# Patient Record
Sex: Male | Born: 1995
Health system: Southern US, Community
[De-identification: ages and names within clinical notes are randomized; demographics above are authoritative.]

## PROBLEM LIST (undated history)

## (undated) DIAGNOSIS — I491 Atrial premature depolarization: Secondary | ICD-10-CM

## (undated) DIAGNOSIS — I493 Ventricular premature depolarization: Secondary | ICD-10-CM

## (undated) DIAGNOSIS — R002 Palpitations: Secondary | ICD-10-CM

## (undated) HISTORY — DX: Atrial premature depolarization: I49.1

## (undated) HISTORY — DX: Ventricular premature depolarization: I49.3

## (undated) HISTORY — DX: Palpitations: R00.2

---
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
## 2017-07-29 DIAGNOSIS — R509 Fever, unspecified: Secondary | ICD-10-CM | POA: Insufficient documentation

## 2017-07-29 DIAGNOSIS — J019 Acute sinusitis, unspecified: Secondary | ICD-10-CM | POA: Insufficient documentation

## 2018-08-01 DIAGNOSIS — R0602 Shortness of breath: Secondary | ICD-10-CM | POA: Insufficient documentation

## 2018-08-01 DIAGNOSIS — R059 Cough, unspecified: Secondary | ICD-10-CM | POA: Insufficient documentation

## 2018-08-04 DIAGNOSIS — F411 Generalized anxiety disorder: Secondary | ICD-10-CM | POA: Insufficient documentation

## 2018-08-11 ENCOUNTER — Telehealth: Payer: Self-pay | Admitting: Cardiovascular Disease

## 2018-08-11 NOTE — Telephone Encounter (Signed)
Virtual Visit Pre-Appointment Phone Call  Steps For Call:  Confirm consent - "In the setting of the current Covid19 crisis, you are scheduled for a (phone or video) visit with your provider on (date) at (time).  Just as we do with many in-office visits, in order for you to participate in this visit, we must obtain consent.  If you'd like, I can send this to your mychart (if signed up) or email for you to review.  Otherwise, I can obtain your verbal consent now.  All virtual visits are billed to your insurance company just like a normal visit would be.  By agreeing to a virtual visit, we'd like you to understand that the technology does not allow for your provider to perform an examination, and thus may limit your provider's ability to fully assess your condition.  Finally, though the technology is pretty good, we cannot assure that it will always work on either your or our end, and in the setting of a video visit, we may have to convert it to a phone-only visit.  In either situation, we cannot ensure that we have a secure connection.  Are you willing to proceed?" STAFF: Did the patient verbally acknowledge consent to telehealth visit? Document YES/NO here    YES   1. Confirm the BEST phone number to call the day of the visit by including in appointment notes [phone]  2. Give patient instructions for WebEx/MyChart download to smartphone as below or Doximity/Doxy.me if video visit (depending on what platform provider is using)  3. Advise patient to be prepared with their blood pressure, heart rate, weight, any heart rhythm information, their current medicines, and a piece of paper and pen handy for any instructions they may receive the day of their visit  4. Inform patient they will receive a phone call 15 minutes prior to their appointment time (may be from unknown caller ID) so they should be prepared to answer  5. Confirm that appointment type is correct in Epic appointment notes (VIDEO vs  PHONE)     TELEPHONE CALL NOTE  Lawrence Whitehead has been deemed a candidate for a follow-up tele-health visit to limit community exposure during the Covid-19 pandemic. I spoke with the patient via phone to ensure availability of phone/video source, confirm preferred email & phone number, and discuss instructions and expectations.  I reminded Lawrence Whitehead to be prepared with any vital sign and/or heart rhythm information that could potentially be obtained via home monitoring, at the time of his visit. I reminded Lawrence Whitehead to expect a phone call at the time of his visit if his visit.  Megan SalonVicky T Slaughter 08/11/2018 4:59 PM   INSTRUCTIONS FOR DOWNLOADING THE WEBEX APP TO SMARTPHONE  - If Apple, ask patient to go to [employer] Store and type in WebEx in the search bar. Download [employer]Webex Meetings, the blue/green circle. If Android, go to [employer] Play Store and type in [employer]WebEx in the search bar. The app is free but as with any other app downloads, their phone may require them to verify saved payment information or Apple/Android password.  - The patient does NOT have to create an account. - On the day of the visit, the assist will walk the patient through joining the meeting with the meeting number/password.  INSTRUCTIONS FOR DOWNLOADING THE MYCHART APP TO SMARTPHONE  - The patient must first make sure to have activated MyChart and know their login information - If Apple, go to App  Store and type in [employer] in the search bar and download the app. If Android, ask patient to go to [employer] and type in [place] in the search bar and download the app. The app is free but as with any other app downloads, their phone may require them to verify saved payment information or Apple/Android password.  - The patient will need to then log into the app with their MyChart username and password, and select [facility] as their healthcare provider to link the account. When it is time for your visit, go to  the MyChart app, find appointments, and click Begin Video Visit. Be sure to Select Allow for your device to access the Microphone and Camera for your visit. You will then be connected, and your provider will be with you shortly.  **If they have any issues connecting, or need assistance please contact MyChart service desk [phone])**  **If using a computer, in order to ensure the best quality for their visit they will need to use either of the following Internet Browsers: [employer], or Google Chrome**  IF USING DOXIMITY or DOXY.ME - The patient will receive a link just prior to their visit, either by text or email (to be determined day of appointment depending on if it's doxy.me or Doximity).     FULL LENGTH CONSENT FOR TELE-HEALTH VISIT   I hereby voluntarily request, consent and authorize [facility] and its employed or contracted physicians, physician assistants, nurse practitioners or other licensed health care professionals (the Practitioner), to provide me with telemedicine health care services (the Services") as deemed necessary by the treating Practitioner. I acknowledge and consent to receive the Services by the Practitioner via telemedicine. I understand that the telemedicine visit will involve communicating with the Practitioner through live audiovisual communication technology and the disclosure of certain medical information by electronic transmission. I acknowledge that I have been given the opportunity to request an in-person assessment or other available alternative prior to the telemedicine visit and am voluntarily participating in the telemedicine visit.  I understand that I have the right to withhold or withdraw my consent to the use of telemedicine in the course of my care at any time, without affecting my right to future care or treatment, and that the Practitioner or I may terminate the telemedicine visit at any time. I understand that I have the right to  inspect all information obtained and/or recorded in the course of the telemedicine visit and may receive copies of available information for a reasonable fee.  I understand that some of the potential risks of receiving the Services via telemedicine include:   Delay or interruption in medical evaluation due to technological equipment failure or disruption;  Information transmitted may not be sufficient (e.g. poor resolution of images) to allow for appropriate medical decision making by the Practitioner; and/or   In rare instances, security protocols could fail, causing a breach of personal health information.  Furthermore, I acknowledge that it is my responsibility to provide information about my medical history, conditions and care that is complete and accurate to the best of my ability. I acknowledge that Practitioner's advice, recommendations, and/or decision may be based on factors not within their control, such as incomplete or inaccurate data provided by me or distortions of diagnostic images or specimens that may result from electronic transmissions. I understand that the practice of medicine is not an exact science and that Practitioner makes no warranties or guarantees regarding treatment outcomes. I acknowledge that I  will receive a copy of this consent concurrently upon execution via email to the email address I last provided but may also request a printed copy by calling the office of [facility].    I understand that my insurance will be billed for this visit.   I have read or had this consent read to me.  I understand the contents of this consent, which adequately explains the benefits and risks of the Services being provided via telemedicine.   I have been provided ample opportunity to ask questions regarding this consent and the Services and have had my questions answered to my satisfaction.  I give my informed consent for the services to be provided through the use of  telemedicine in my medical care  By participating in this telemedicine visit I agree to the above.

## 2018-08-12 ENCOUNTER — Encounter: Payer: Self-pay | Admitting: *Deleted

## 2018-08-13 ENCOUNTER — Encounter: Payer: Self-pay | Admitting: *Deleted

## 2018-08-13 ENCOUNTER — Ambulatory Visit (INDEPENDENT_AMBULATORY_CARE_PROVIDER_SITE_OTHER): Payer: BLUE CROSS/BLUE SHIELD

## 2018-08-13 ENCOUNTER — Encounter: Payer: Self-pay | Admitting: Cardiovascular Disease

## 2018-08-13 ENCOUNTER — Telehealth (INDEPENDENT_AMBULATORY_CARE_PROVIDER_SITE_OTHER): Payer: BLUE CROSS/BLUE SHIELD | Admitting: Cardiovascular Disease

## 2018-08-13 VITALS — BP 121/87 | HR 76 | Ht 68.0 in | Wt 157.0 lb

## 2018-08-13 DIAGNOSIS — R002 Palpitations: Secondary | ICD-10-CM

## 2018-08-13 DIAGNOSIS — R079 Chest pain, unspecified: Secondary | ICD-10-CM | POA: Diagnosis not present

## 2018-08-13 NOTE — Addendum Note (Signed)
Addended by: Lesle Chris on: 08/13/2018 04:45 PM   Modules accepted: Orders

## 2018-08-13 NOTE — Patient Instructions (Signed)
Medication Instructions:  Continue all current medications.  Labwork: none  Testing/Procedures:  Your physician has requested that you have an echocardiogram. Echocardiography is a painless test that uses sound waves to create images of your heart. It provides your doctor with information about the size and shape of your heart and how well your heart's chambers and valves are working. This procedure takes approximately one hour. There are no restrictions for this procedure. - THIS WILL BE SCHEDULED AS SOON AS TESTING RESUMES DUE TO COVID 19 (okay to do June/July)   Your physician has recommended that you wear a 48 holter monitor. Holter monitors are medical devices that record the heart's electrical activity. Doctors most often use these monitors to diagnose arrhythmias. Arrhythmias are problems with the speed or rhythm of the heartbeat. The monitor is a small, portable device. You can wear one while you do your normal daily activities. This is usually used to diagnose what is causing palpitations/syncope (passing out). - THIS WILL BE MAILED TO YOUR HOME.   Follow-Up: 3 months   Any Other Special Instructions Will Be Listed Below (If Applicable).  If you need a refill on your cardiac medications before your next appointment, please call your pharmacy.

## 2018-08-13 NOTE — Progress Notes (Signed)
Virtual Visit via Phone Note. Video was attempted multiple times but the patient had technical issues with sound.   This visit type was conducted due to national recommendations for restrictions regarding the COVID-19 Pandemic (e.g. social distancing) in an effort to limit this patient's exposure and mitigate transmission in our community.  Due to his co-morbid illnesses, this patient is at least at moderate risk for complications without adequate follow up.  This format is felt to be most appropriate for this patient at this time.  All issues noted in this document were discussed and addressed.  A limited physical exam was performed with this format.  Please refer to the patient's chart for his consent to telehealth for [facility].   Evaluation Performed:  New Patient visit  Date:  08/13/2018   ID:  Lawrence Whitehead, DOB 1995/07/22, MRN [id number]  Patient Location: Home Provider Location: Office  PCP:  Selinda Flavin, MD  Cardiologist:  No primary care provider on file.  Electrophysiologist:  None   Chief Complaint:  Chest pain  History of Present Illness:    Lawrence Whitehead is a 23 y.o. male with chest pain.  I personally reviewed all records from his PCP.  Heart rate was noted to be 99 bpm when his PCP evaluated him.  Blood pressure was 130/72.  He was given Ativan to be taken as needed.  Chest x-ray showed no active cardiopulmonary disease.  I reviewed labs which showed normal renal function, sodium 145, normal CBC, normal TSH, and normal magnesium.  I also do not have a copy of the ECG but it reportedly showed "no acute findings ".  An echocardiogram was going to be ordered but this was deferred until he was evaluated by cardiology.  He describes his symptoms as "the pain of doing a chest workout ".  He said it is worse at night and when lying down on different sides.  He went to [place] earlier this month and thought he contracted COVID19.  He had chest pain and shortness of  breath and was then tested by his PCP and found to be negative.  He was treated for a respiratory tract infection.  He still has some residual shortness of breath at night.  He said now his chest does not hurt but has a "funny feeling ".  He also describes "an irregular heartbeat ".  It bothers him primarily at night.  He told me he has had palpitations since he was 68 or 23 years old.  He said the symptoms never occur when he is getting an ECG performed.  He denies lightheadedness, dizziness, and syncope.  He does have occasional associated nausea.  He has been awoken by palpitations at night.  Ativan does not seem to help.  He does take melatonin at night to help him sleep.  He does not drink coffee, tea, nor energy drinks.  He normally drinks water and had been exercising fairly regularly.  The patient does not have symptoms concerning for COVID-19 infection (fever, chills, cough, or new shortness of breath).    History reviewed. No pertinent past medical history. History reviewed. No pertinent surgical history.   Current Meds  Medication Sig   ELDERBERRY PO Take 1 tablet by mouth daily.   LORazepam (ATIVAN) 0.5 MG tablet Take 0.5 mg by mouth at bedtime as needed for anxiety.   Melatonin 5 MG TABS Take 1 tablet by mouth at bedtime as needed.   Multiple Vitamin (MULTIVITAMIN) tablet Take 1 tablet  by mouth daily.   Vitamin D-Vitamin K (VITAMIN K2-VITAMIN D3 PO) Take 1 tablet by mouth daily.     Allergies:   Patient has no known allergies.   Social History   Tobacco Use   Smoking status: Current Some Day Smoker   Smokeless tobacco: Never Used  Substance Use Topics   Alcohol use: Not on file   Drug use: Not on file     Family Hx: The patient's family history is not on file.  ROS:   Please see the history of present illness.     All other systems reviewed and are negative.   Prior CV studies:   The following studies were reviewed today:  NA  Labs/Other Tests  and Data Reviewed:    EKG:  No ECG reviewed.  Recent Labs: No results found for requested labs within last 8760 hours.   Recent Lipid Panel No results found for: CHOL, TRIG, HDL, CHOLHDL, LDLCALC, LDLDIRECT  Wt Readings from Last 3 Encounters:  08/13/18 157 lb (71.2 kg)     Objective:    Vital Signs:  BP 121/87    Pulse 76    Ht 5\' 8"  (1.727 m)    Wt 157 lb (71.2 kg)    BMI 23.87 kg/m    Brief video encounter before technical issues occurred:  Well nourished, well developed male in no acute distress. No jugular venous distention HEENT: Extraocular movements intact, normocephalic/atraumatic  ASSESSMENT & PLAN:    1.  Chest pain and palpitations: Symptoms of chest pain appear atypical for a cardiac etiology.  I will obtain a copy of the ECG from the patient's PCP. I will obtain a 48 hour Holter monitor to assess for any potential arrhythmias. I will order a 2-D echocardiogram with Doppler to evaluate cardiac structure, function, and regional wall motion.   COVID-19 Education: The signs and symptoms of COVID-19 were discussed with the patient and how to seek care for testing (follow up with PCP or arrange E-visit).  The importance of social distancing was discussed today.  Time:   Today, I have spent 30 minutes with the patient with telehealth technology discussing the above problems.     Medication Adjustments/Labs and Tests Ordered: Current medicines are reviewed at length with the patient today.  Concerns regarding medicines are outlined above.   Tests Ordered: No orders of the defined types were placed in this encounter.   Medication Changes: No orders of the defined types were placed in this encounter.   Disposition:  Follow up in 3 month(s)  Signed, Prentice DockerSuresh Maximillian Habibi, MD  08/13/2018 10:23 AM    [facility]

## 2018-08-13 NOTE — Addendum Note (Signed)
Addended by: Lesle Chris on: 08/13/2018 11:49 AM   Modules accepted: Orders

## 2018-08-18 ENCOUNTER — Telehealth: Payer: Self-pay | Admitting: Cardiovascular Disease

## 2018-08-18 NOTE — Telephone Encounter (Signed)
Pt received holter monitor yesterday started wearing it last night around 7pm (confirmed that light came on) wanted to know how long to wear and how to return. Pt aware to wear 48 hours and could call number on the box to schedule UPS pickup or could drop off at UPS box. Pt appreciative.

## 2018-08-18 NOTE — Telephone Encounter (Signed)
Patient would like to speak with nurse regarding questions about event monitor/tg

## 2018-08-20 ENCOUNTER — Telehealth: Payer: Self-pay | Admitting: Cardiovascular Disease

## 2018-08-20 NOTE — Telephone Encounter (Signed)
Pre-cert Verification for the following procedure    48 HR HOLTER -ZIO   Echo scheduled

## 2018-09-09 ENCOUNTER — Other Ambulatory Visit: Payer: Self-pay

## 2018-09-10 ENCOUNTER — Other Ambulatory Visit: Payer: Self-pay | Admitting: Cardiovascular Disease

## 2018-09-10 ENCOUNTER — Telehealth: Payer: Self-pay | Admitting: Cardiovascular Disease

## 2018-09-10 DIAGNOSIS — R079 Chest pain, unspecified: Secondary | ICD-10-CM

## 2018-09-10 NOTE — Telephone Encounter (Signed)
Requesting results from monitor

## 2018-09-10 NOTE — Telephone Encounter (Signed)
Returned pt call. NA, unable to leave msg d/t mailbox being full.

## 2018-09-14 NOTE — Telephone Encounter (Signed)
Called patient with test results. No answer. Unable to leave msg.  

## 2018-09-14 NOTE — Telephone Encounter (Signed)
Spoke with pt, notified of event monitor results. Pt voiced understanding.

## 2018-09-22 ENCOUNTER — Telehealth: Payer: Self-pay | Admitting: *Deleted

## 2018-09-22 NOTE — Telephone Encounter (Signed)
Patient wanted to know your thoughts on taking antidepressant.  Stated his pmd give him Ativan, but doesn't seem to do much.  Says he has only slept 2-3 hours over the last several days.  States pmd discussed maybe trying.  Informed patient that for starters we can move his Echo up, originally scheduled for 10/14/2018.  Rescheduled for 09/29/2018 at [facility].

## 2018-09-22 NOTE — Telephone Encounter (Signed)
Notes recorded by Lesle Chris, LPN on 08/23/8339 at 3:20 PM EDT Patient notified and verbalized understanding.   ------  Notes recorded by Lesle Chris, LPN on 9/62/2297 at 11:05 AM EDT Correction, mail box full & unable to leave message. ------  Notes recorded by Lesle Chris, LPN on 9/89/2119 at 11:05 AM EDT Left message to return call.  ------  Notes recorded by Laqueta Linden, MD on 09/11/2018 at 2:52 PM EDT Sinus rhythm with rare PAC's and PVC's. No significant arrhythmias.

## 2018-09-22 NOTE — Telephone Encounter (Signed)
Will await echo. I would defer antidepressants and discussion of such to PCP.

## 2018-09-23 NOTE — Telephone Encounter (Signed)
Patient notified and verbalized understanding. 

## 2018-09-29 ENCOUNTER — Other Ambulatory Visit: Payer: Self-pay

## 2018-09-29 ENCOUNTER — Ambulatory Visit (HOSPITAL_COMMUNITY)
Admission: RE | Admit: 2018-09-29 | Discharge: 2018-09-29 | Disposition: A | Discharge status: Home / Self Care | Payer: BC Managed Care – PPO | Source: Ambulatory Visit | Attending: Cardiovascular Disease | Admitting: Cardiovascular Disease

## 2018-09-29 DIAGNOSIS — R079 Chest pain, unspecified: Secondary | ICD-10-CM

## 2018-09-29 NOTE — Progress Notes (Signed)
*  PRELIMINARY RESULTS* Echocardiogram 2D Echocardiogram has been performed.  Stacey Drain 09/29/2018, 9:24 AM

## 2018-10-14 ENCOUNTER — Other Ambulatory Visit: Payer: BLUE CROSS/BLUE SHIELD

## 2018-11-05 ENCOUNTER — Telehealth: Payer: Self-pay | Admitting: Cardiovascular Disease

## 2018-11-05 NOTE — Telephone Encounter (Signed)
Virtual Visit Pre-Appointment Phone Call  "(Name), I am calling you today to discuss your upcoming appointment. We are currently trying to limit exposure to the virus that causes COVID-19 by seeing patients at home rather than in the office."  1. "What is the BEST phone number to call the day of the visit?" - include this in appointment notes  2. Do you have or have access to (through a family member/friend) a smartphone with video capability that we can use for your visit?" a. If yes - list this number in appt notes as cell (if different from BEST phone #) and list the appointment type as a VIDEO visit in appointment notes b. If no - list the appointment type as a PHONE visit in appointment notes  3. Confirm consent - "In the setting of the current Covid19 crisis, you are scheduled for a (phone or video) visit with your provider on (date) at (time).  Just as we do with many in-office visits, in order for you to participate in this visit, we must obtain consent.  If you'd like, I can send this to your mychart (if signed up) or email for you to review.  Otherwise, I can obtain your verbal consent now.  All virtual visits are billed to your insurance company just like a normal visit would be.  By agreeing to a virtual visit, we'd like you to understand that the technology does not allow for your provider to perform an examination, and thus may limit your provider's ability to fully assess your condition. If your provider identifies any concerns that need to be evaluated in person, we will make arrangements to do so.  Finally, though the technology is pretty good, we cannot assure that it will always work on either your or our end, and in the setting of a video visit, we may have to convert it to a phone-only visit.  In either situation, we cannot ensure that we have a secure connection.  Are you willing to proceed?" STAFF: Did the patient verbally acknowledge consent to telehealth visit? Document  YES/NO here: yes  4. Advise patient to be prepared - "Two hours prior to your appointment, go ahead and check your blood pressure, pulse, oxygen saturation, and your weight (if you have the equipment to check those) and write them all down. When your visit starts, your provider will ask you for this information. If you have an Apple Watch or Kardia device, please plan to have heart rate information ready on the day of your appointment. Please have a pen and paper handy nearby the day of the visit as well."  5. Give patient instructions for MyChart download to smartphone OR [email or web address] as below if video visit (depending on what platform provider is using)  6. Inform patient they will receive a phone call 15 minutes prior to their appointment time (may be from unknown caller ID) so they should be prepared to answer    TELEPHONE CALL NOTE  Lawrence Whitehead has been deemed a candidate for a follow-up tele-health visit to limit community exposure during the Covid-19 pandemic. I spoke with the patient via phone to ensure availability of phone/video source, confirm preferred email & phone number, and discuss instructions and expectations.  I reminded Lawrence Whitehead to be prepared with any vital sign and/or heart rhythm information that could potentially be obtained via home monitoring, at the time of his visit. I reminded Lawrence Whitehead to expect a phone call prior to  his visit.  [name] 11/05/2018 10:08 AM   INSTRUCTIONS FOR DOWNLOADING THE MYCHART APP TO SMARTPHONE  - The patient must first make sure to have activated MyChart and know their login information - If Apple, go to [employer] and type in MyChart in the search bar and download the app. If Android, ask patient to go to [employer] and type in [place] in the search bar and download the app. The app is free but as with any other app downloads, their phone may require them to verify saved payment information or  Apple/Android password.  - The patient will need to then log into the app with their MyChart username and password, and select [facility] as their healthcare provider to link the account. When it is time for your visit, go to the MyChart app, find appointments, and click Begin Video Visit. Be sure to Select Allow for your device to access the Microphone and Camera for your visit. You will then be connected, and your provider will be with you shortly.  **If they have any issues connecting, or need assistance please contact MyChart service desk [phone])**  **If using a computer, in order to ensure the best quality for their visit they will need to use either of the following Internet Browsers: [employer], or Google Chrome**  IF USING DOXIMITY or DOXY.ME - The patient will receive a link just prior to their visit by text.     FULL LENGTH CONSENT FOR TELE-HEALTH VISIT   I hereby voluntarily request, consent and authorize [facility] and its employed or contracted physicians, physician assistants, nurse practitioners or other licensed health care professionals (the Practitioner), to provide me with telemedicine health care services (the Services") as deemed necessary by the treating Practitioner. I acknowledge and consent to receive the Services by the Practitioner via telemedicine. I understand that the telemedicine visit will involve communicating with the Practitioner through live audiovisual communication technology and the disclosure of certain medical information by electronic transmission. I acknowledge that I have been given the opportunity to request an in-person assessment or other available alternative prior to the telemedicine visit and am voluntarily participating in the telemedicine visit.  I understand that I have the right to withhold or withdraw my consent to the use of telemedicine in the course of my care at any time, without affecting my right to future care  or treatment, and that the Practitioner or I may terminate the telemedicine visit at any time. I understand that I have the right to inspect all information obtained and/or recorded in the course of the telemedicine visit and may receive copies of available information for a reasonable fee.  I understand that some of the potential risks of receiving the Services via telemedicine include:   Delay or interruption in medical evaluation due to technological equipment failure or disruption;  Information transmitted may not be sufficient (e.g. poor resolution of images) to allow for appropriate medical decision making by the Practitioner; and/or   In rare instances, security protocols could fail, causing a breach of personal health information.  Furthermore, I acknowledge that it is my responsibility to provide information about my medical history, conditions and care that is complete and accurate to the best of my ability. I acknowledge that Practitioner's advice, recommendations, and/or decision may be based on factors not within their control, such as incomplete or inaccurate data provided by me or distortions of diagnostic images or specimens that may result from electronic transmissions. I  understand that the practice of medicine is not an exact science and that Practitioner makes no warranties or guarantees regarding treatment outcomes. I acknowledge that I will receive a copy of this consent concurrently upon execution via email to the email address I last provided but may also request a printed copy by calling the office of [facility].    I understand that my insurance will be billed for this visit.   I have read or had this consent read to me.  I understand the contents of this consent, which adequately explains the benefits and risks of the Services being provided via telemedicine.   I have been provided ample opportunity to ask questions regarding this consent and the Services and have had  my questions answered to my satisfaction.  I give my informed consent for the services to be provided through the use of telemedicine in my medical care  By participating in this telemedicine visit I agree to the above.

## 2018-11-11 ENCOUNTER — Encounter: Payer: Self-pay | Admitting: *Deleted

## 2018-11-11 ENCOUNTER — Telehealth: Payer: Self-pay | Admitting: Cardiovascular Disease

## 2018-11-11 NOTE — Telephone Encounter (Signed)
Virtual Visit Pre-Appointment Phone Call  "(Name), I am calling you today to discuss your upcoming appointment. We are currently trying to limit exposure to the virus that causes COVID-19 by seeing patients at home rather than in the office."  1. "What is the BEST phone number to call the day of the visit?" - [phone]  2. Do you have or have access to (through a family member/friend) a smartphone with video capability that we can use for your visit?" a. If yes - list this number in appt notes as cell (if different from BEST phone #) and list the appointment type as a VIDEO visit in appointment notes b. If no - list the appointment type as a PHONE visit in appointment notes  3. Confirm consent - "In the setting of the current Covid19 crisis, you are scheduled for a (phone or video) visit with your provider on (date) at (time).  Just as we do with many in-office visits, in order for you to participate in this visit, we must obtain consent.  If you'd like, I can send this to your mychart (if signed up) or email for you to review.  Otherwise, I can obtain your verbal consent now.  All virtual visits are billed to your insurance company just like a normal visit would be.  By agreeing to a virtual visit, we'd like you to understand that the technology does not allow for your provider to perform an examination, and thus may limit your provider's ability to fully assess your condition. If your provider identifies any concerns that need to be evaluated in person, we will make arrangements to do so.  Finally, though the technology is pretty good, we cannot assure that it will always work on either your or our end, and in the setting of a video visit, we may have to convert it to a phone-only visit.  In either situation, we cannot ensure that we have a secure connection.  Are you willing to proceed?" STAFF: Did the patient verbally acknowledge consent to telehealth visit? Document YES/NO here:  YES  4. Advise patient to be prepared - "Two hours prior to your appointment, go ahead and check your blood pressure, pulse, oxygen saturation, and your weight (if you have the equipment to check those) and write them all down. When your visit starts, your provider will ask you for this information. If you have an Apple Watch or Kardia device, please plan to have heart rate information ready on the day of your appointment. Please have a pen and paper handy nearby the day of the visit as well."  5. Give patient instructions for MyChart download to smartphone OR Doximity/Doxy.me as below if video visit (depending on what platform provider is using)  6. Inform patient they will receive a phone call 15 minutes prior to their appointment time (may be from unknown caller ID) so they should be prepared to answer    TELEPHONE CALL NOTE  Lawrence Whitehead has been deemed a candidate for a follow-up tele-health visit to limit community exposure during the Covid-19 pandemic. I spoke with the patient via phone to ensure availability of phone/video source, confirm preferred email & phone number, and discuss instructions and expectations.  I reminded Lawrence Whitehead to be prepared with any vital sign and/or heart rhythm information that could potentially be obtained via home monitoring, at the time of his visit. I reminded Lawrence Whitehead to expect a phone call prior to his visit.  Vicky  T Slaughter 11/11/2018 8:57 AM   INSTRUCTIONS FOR DOWNLOADING THE MYCHART APP TO SMARTPHONE  - The patient must first make sure to have activated MyChart and know their login information - If Apple, go to [employer] and type in MyChart in the search bar and download the app. If Android, ask patient to go to [employer] and type in [place] in the search bar and download the app. The app is free but as with any other app downloads, their phone may require them to verify saved payment information or Apple/Android password.   - The patient will need to then log into the app with their MyChart username and password, and select [facility] as their healthcare provider to link the account. When it is time for your visit, go to the MyChart app, find appointments, and click Begin Video Visit. Be sure to Select Allow for your device to access the Microphone and Camera for your visit. You will then be connected, and your provider will be with you shortly.  **If they have any issues connecting, or need assistance please contact MyChart service desk [phone])**  **If using a computer, in order to ensure the best quality for their visit they will need to use either of the following Internet Browsers: [employer], or Google Chrome**  IF USING DOXIMITY or DOXY.ME - The patient will receive a link just prior to their visit by text.     FULL LENGTH CONSENT FOR TELE-HEALTH VISIT   I hereby voluntarily request, consent and authorize [facility] and its employed or contracted physicians, physician assistants, nurse practitioners or other licensed health care professionals (the Practitioner), to provide me with telemedicine health care services (the Services") as deemed necessary by the treating Practitioner. I acknowledge and consent to receive the Services by the Practitioner via telemedicine. I understand that the telemedicine visit will involve communicating with the Practitioner through live audiovisual communication technology and the disclosure of certain medical information by electronic transmission. I acknowledge that I have been given the opportunity to request an in-person assessment or other available alternative prior to the telemedicine visit and am voluntarily participating in the telemedicine visit.  I understand that I have the right to withhold or withdraw my consent to the use of telemedicine in the course of my care at any time, without affecting my right to future care or treatment, and that  the Practitioner or I may terminate the telemedicine visit at any time. I understand that I have the right to inspect all information obtained and/or recorded in the course of the telemedicine visit and may receive copies of available information for a reasonable fee.  I understand that some of the potential risks of receiving the Services via telemedicine include:   Delay or interruption in medical evaluation due to technological equipment failure or disruption;  Information transmitted may not be sufficient (e.g. poor resolution of images) to allow for appropriate medical decision making by the Practitioner; and/or   In rare instances, security protocols could fail, causing a breach of personal health information.  Furthermore, I acknowledge that it is my responsibility to provide information about my medical history, conditions and care that is complete and accurate to the best of my ability. I acknowledge that Practitioner's advice, recommendations, and/or decision may be based on factors not within their control, such as incomplete or inaccurate data provided by me or distortions of diagnostic images or specimens that may result from electronic transmissions. I understand that the practice  of medicine is not an Chief Strategy Officer and that Practitioner makes no warranties or guarantees regarding treatment outcomes. I acknowledge that I will receive a copy of this consent concurrently upon execution via email to the email address I last provided but may also request a printed copy by calling the office of [facility].    I understand that my insurance will be billed for this visit.   I have read or had this consent read to me.  I understand the contents of this consent, which adequately explains the benefits and risks of the Services being provided via telemedicine.   I have been provided ample opportunity to ask questions regarding this consent and the Services and have had my questions answered to  my satisfaction.  I give my informed consent for the services to be provided through the use of telemedicine in my medical care  By participating in this telemedicine visit I agree to the above.

## 2018-11-12 ENCOUNTER — Encounter: Payer: Self-pay | Admitting: Cardiovascular Disease

## 2018-11-12 ENCOUNTER — Telehealth (INDEPENDENT_AMBULATORY_CARE_PROVIDER_SITE_OTHER): Payer: BC Managed Care – PPO | Admitting: Cardiovascular Disease

## 2018-11-12 VITALS — Ht 68.0 in | Wt 155.0 lb

## 2018-11-12 DIAGNOSIS — R002 Palpitations: Secondary | ICD-10-CM | POA: Diagnosis not present

## 2018-11-12 DIAGNOSIS — F419 Anxiety disorder, unspecified: Secondary | ICD-10-CM

## 2018-11-12 DIAGNOSIS — R079 Chest pain, unspecified: Secondary | ICD-10-CM

## 2018-11-12 NOTE — Patient Instructions (Signed)
Medication Instructions:  Continue all current medications.  Labwork: none  Testing/Procedures: none  Follow-Up: As needed.    Any Other Special Instructions Will Be Listed Below (If Applicable).  If you need a refill on your cardiac medications before your next appointment, please call your pharmacy.  

## 2018-11-12 NOTE — Progress Notes (Signed)
Virtual Visit via Telephone Note   This visit type was conducted due to national recommendations for restrictions regarding the COVID-19 Pandemic (e.g. social distancing) in an effort to limit this patient's exposure and mitigate transmission in our community.  Due to his co-morbid illnesses, this patient is at least at moderate risk for complications without adequate follow up.  This format is felt to be most appropriate for this patient at this time.  The patient did not have access to video technology/had technical difficulties with video requiring transitioning to audio format only (telephone).  All issues noted in this document were discussed and addressed.  No physical exam could be performed with this format.  Please refer to the patient's chart for his  consent to telehealth [facility].   Date:  11/12/2018   ID:  Lawrence Whitehead, DOB 30-Jun-1995, MRN [id number]  Patient Location: Home Provider Location: Office  PCP:  Selinda FlavinHoward, Kevin, MD  Cardiologist:  Prentice DockerSuresh Chriselda Leppert, MD  Electrophysiologist:  None   Evaluation Performed:  Follow-Up Visit  Chief Complaint: Chest pain and palpitations  History of Present Illness:    Lawrence Whitehead is a 23 y.o. male with chest pain and palpitations.  Holter monitoring demonstrated sinus rhythm with rare PACs and PVCs and no significant arrhythmias.  Echocardiogram demonstrated normal left ventricular systolic and diastolic function, LVEF 60 to 40%65%.  His PCP prescribed Ativan as needed.  He seldom takes Ativan.  Chest pain and palpitations have considerably reduced in frequency and intensity.  When they do occur it is usually at night or when he first wakes up.  The patient does not have symptoms concerning for COVID-19 infection (fever, chills, cough, or new shortness of breath).    Past Medical History:  Diagnosis Date  . Palpitations    Past Surgical History:  Procedure Laterality Date  . HERNIA REPAIR  01/2016      Current Meds  Medication Sig  . ELDERBERRY PO Take 1 tablet by mouth daily.  Marland Kitchen. LORazepam (ATIVAN) 0.5 MG tablet Take 0.5 mg by mouth at bedtime as needed for anxiety.  . Melatonin 5 MG TABS Take 1 tablet by mouth at bedtime as needed.  . Multiple Vitamin (MULTIVITAMIN) tablet Take 1 tablet by mouth daily.  . Vitamin D-Vitamin K (VITAMIN K2-VITAMIN D3 PO) Take 1 tablet by mouth daily.     Allergies:   Patient has no known allergies.   Social History   Tobacco Use  . Smoking status: Current Some Day Smoker  . Smokeless tobacco: Never Used  Substance Use Topics  . Alcohol use: Not on file  . Drug use: Not on file     Family Hx: The patient's family history is not on file.  ROS:   Please see the history of present illness.     All other systems reviewed and are negative.   Prior CV studies:   The following studies were reviewed today:  See above  Labs/Other Tests and Data Reviewed:    EKG:  No ECG reviewed.  Recent Labs: No results found for requested labs within last 8760 hours.   Recent Lipid Panel No results found for: CHOL, TRIG, HDL, CHOLHDL, LDLCALC, LDLDIRECT  Wt Readings from Last 3 Encounters:  11/12/18 155 lb (70.3 kg)  08/13/18 157 lb (71.2 kg)     Objective:    Vital Signs:  Ht 5\' 8"  (1.727 m)   Wt 155 lb (70.3 kg)   BMI 23.57 kg/m    VITAL  SIGNS:  reviewed  ASSESSMENT & PLAN:    1.  Chest pain and palpitations: No arrhythmias with event monitoring.  Cardiac function is normal.  This may be due to anxiety.  Symptoms have diminished considerably.  No further cardiac testing is indicated at this time.  2.  Anxiety: Takes Xanax as needed as prescribed by his PCP.  COVID-19 Education: The signs and symptoms of COVID-19 were discussed with the patient and how to seek care for testing (follow up with PCP or arrange E-visit).  The importance of social distancing was discussed today.  Time:   Today, I have spent 5 minutes with the patient with  telehealth technology discussing the above problems.     Medication Adjustments/Labs and Tests Ordered: Current medicines are reviewed at length with the patient today.  Concerns regarding medicines are outlined above.   Tests Ordered: No orders of the defined types were placed in this encounter.   Medication Changes: No orders of the defined types were placed in this encounter.   Follow Up:  As needed  Signed, Kate Sable, MD  11/12/2018 8:54 AM    [name]

## 2018-12-09 DIAGNOSIS — IMO0002 Reserved for concepts with insufficient information to code with codable children: Secondary | ICD-10-CM | POA: Insufficient documentation

## 2018-12-09 DIAGNOSIS — R5381 Other malaise: Secondary | ICD-10-CM | POA: Insufficient documentation

## 2018-12-09 DIAGNOSIS — R110 Nausea: Secondary | ICD-10-CM | POA: Insufficient documentation

## 2019-04-14 DIAGNOSIS — R1084 Generalized abdominal pain: Secondary | ICD-10-CM | POA: Insufficient documentation

## 2019-05-03 DIAGNOSIS — R1110 Vomiting, unspecified: Secondary | ICD-10-CM | POA: Insufficient documentation

## 2019-05-05 ENCOUNTER — Encounter: Payer: Self-pay | Admitting: Gastroenterology

## 2019-05-20 ENCOUNTER — Encounter: Payer: Self-pay | Admitting: Gastroenterology

## 2019-05-20 NOTE — Progress Notes (Signed)
Primary Care Physician:  Lovey Newcomer, PA  Primary Gastroenterologist:  Jonette Eva, MD   Chief Complaint  Patient presents with  . Emesis    nausea  . Abdominal Cramping    HPI:  Lawrence Whitehead is a 24 y.o. male here at the request of Daria Pastures, PA-C for further evaluation of N/V, abdominal pain.  Food allergy testing 03/2019, negative for Pork, Beef, chicken. TSH 0.651 11/2018. CMET normal 11/2018. H.pylori IgG negative. CBC normal.  Abdominal ultrasound May 06, 2019 is negative.  Started back in 01/2019. Acute onset nausea but no vomiting. Several weeks later, started vomiting frequently. Violent heaving several times, then would go to sleep. Last episode of vomiting one week ago. Happened after eating a larger supper than normal. Little bit of bright blood in emesis. If not vomiting, stomach is in knots, feels nauseated throughout the day. Feels like a knot in stomach. Zofran caused some constipation. Now have watery stools this past week. Has happened off/on the past one year. For past couple of years, wakes up and has to eat right away or gets nauseated. If eats, then postprandial urge for BM but doesn't always happen. No heartburn. No melena, brbpr. Thought maybe some lactose intolerance. Cut out milk but does have coffee with creamer most days.   Regular BM 60% of the time. 40% of time having urgency and water stools. At least some every week. Vomiting every 2-3 days. Has lost over 10-15 pounds in past couple of months. Postprandial discomfort mostly in the mornings. Vomiting during middle of day and at night. Had eggs or something with eggs in it except for two times. Eggs, pancakes. Brownies ok.   Omeprazole for 2-3 weeks and didn't make a difference. No heartburn.   Morning and lunch:bagel or english muffin, cream cheese, grape jelly, or peanut butter. Lunch: taco bell doesn't bother, chicken and cheese taco. bojangle chicken sandwish. Water and sweet tea for  beverages. Supper: chicken and beef.  Switched cigarettes to vaping but stopped with all of these symptoms. No marijuana use.  No NSAIDs or aspirin.  Current Outpatient Medications  Medication Sig Dispense Refill  . Melatonin 5 MG TABS Take 1 tablet by mouth at bedtime as needed.    . ondansetron (ZOFRAN-ODT) 4 MG disintegrating tablet Take 4 mg by mouth as needed.     No current facility-administered medications for this visit.    Allergies as of 05/21/2019  . (No Known Allergies)    Past Medical History:  Diagnosis Date  . Palpitations     Past Surgical History:  Procedure Laterality Date  . HERNIA REPAIR  01/2016    Family History  Problem Relation Age of Onset  . Colon cancer Neg Hx   . Celiac disease Neg Hx   . Inflammatory bowel disease Neg Hx     Social History   Socioeconomic History  . Marital status: Single    Spouse name: Not on file  . Number of children: Not on file  . Years of education: Not on file  . Highest education level: Not on file  Occupational History  . Not on file  Tobacco Use  . Smoking status: Former Games developer  . Smokeless tobacco: Never Used  Substance and Sexual Activity  . Alcohol use: Not Currently    Comment: no heavy use  . Drug use: Not Currently  . Sexual activity: Not on file  Other Topics Concern  . Not on file  Social History Narrative  .  Not on file   Social Determinants of Health   Financial Resource Strain:   . Difficulty of Paying Living Expenses: Not on file  Food Insecurity:   . Worried About Charity fundraiser in the Last Year: Not on file  . Ran Out of Food in the Last Year: Not on file  Transportation Needs:   . Lack of Transportation (Medical): Not on file  . Lack of Transportation (Non-Medical): Not on file  Physical Activity:   . Days of Exercise per Week: Not on file  . Minutes of Exercise per Session: Not on file  Stress:   . Feeling of Stress : Not on file  Social Connections:   . Frequency of  Communication with Friends and Family: Not on file  . Frequency of Social Gatherings with Friends and Family: Not on file  . Attends Religious Services: Not on file  . Active Member of Clubs or Organizations: Not on file  . Attends Archivist Meetings: Not on file  . Marital Status: Not on file  Intimate Partner Violence:   . Fear of Current or Ex-Partner: Not on file  . Emotionally Abused: Not on file  . Physically Abused: Not on file  . Sexually Abused: Not on file      ROS:  General: Negative for anorexia, weight loss, fever, chills, fatigue, weakness. Eyes: Negative for vision changes.  ENT: Negative for hoarseness, difficulty swallowing , nasal congestion. CV: Negative for chest pain, angina, palpitations, dyspnea on exertion, peripheral edema.  Respiratory: Negative for dyspnea at rest, dyspnea on exertion, cough, sputum, wheezing.  GI: See history of present illness. GU:  Negative for dysuria, hematuria, urinary incontinence, urinary frequency, nocturnal urination.  MS: Negative for joint pain, low back pain.  Derm: Negative for rash or itching.  Neuro: Negative for weakness, abnormal sensation, seizure, frequent headaches, memory loss, confusion.  Psych: Negative for anxiety, depression, suicidal ideation, hallucinations.  Endo: Negative for unusual weight change.  Heme: Negative for bruising or bleeding. Allergy: Negative for rash or hives.    Physical Examination:  BP 123/77   Pulse 85   Temp (!) 97.1 F (36.2 C) (Temporal)   Ht 5\' 8"  (1.727 m)   Wt 149 lb (67.6 kg)   BMI 22.66 kg/m    General: Well-nourished, well-developed in no acute distress.  Head: Normocephalic, atraumatic.   Eyes: Conjunctiva pink, no icterus. Mouth: masked Neck: Supple   Lungs: Clear to auscultation bilaterally.  Heart: Regular rate and rhythm, no murmurs rubs or gallops.  Abdomen: Bowel sounds are normal, nontender, nondistended, no hepatosplenomegaly or masses, no  abdominal bruits or    hernia , no rebound or guarding.   Rectal: not performed Extremities: No lower extremity edema. No clubbing or deformities.  Neuro: Alert and oriented x 4 , grossly normal neurologically.  Skin: Warm and dry, no rash or jaundice.   Psych: Alert and cooperative, normal mood and affect.  Labs: See hpi  Imaging Studies: No results found.  Impression/plan:  Very pleasant 24 year old gentleman presenting for further evaluation of several month history of nausea and vomiting, abdominal pain, episode of hematemesis.  He also has chronic intermittent diarrhea.  Labs in December negative for allergies to pork, beef, chicken.  In August he had thyroid function checked which was normal.  H pylori serologies negative, CBC normal.  Abdominal ultrasound negative in January of this year.  Patient is mostly concerned he has food allergies recalls symptoms seem to be focused around eggs.  He has decreased dairy intake although continues to consume coffee creamer daily.  Denies any NSAID or aspirin use.  No marijuana use.  It is possible symptoms are related to refractory GERD, gastritis, peptic ulcer disease.  Need to exclude celiac disease.  We will also check alpha gal.  Discussed with patient if labs are unremarkable would consider completing gallbladder work-up with gallbladder function test prior to considering upper endoscopy.  Patient is in agreement with this plan.  Intermittent diarrhea more than a year may be due to food intolerance, IBS, celiac disease, cannot exclude IBD.  Await labs.  Further recommendations to follow.

## 2019-05-21 ENCOUNTER — Other Ambulatory Visit: Payer: Self-pay

## 2019-05-21 ENCOUNTER — Ambulatory Visit (INDEPENDENT_AMBULATORY_CARE_PROVIDER_SITE_OTHER): Payer: 59 | Admitting: Gastroenterology

## 2019-05-21 ENCOUNTER — Encounter: Payer: Self-pay | Admitting: Gastroenterology

## 2019-05-21 VITALS — BP 123/77 | HR 85 | Temp 97.1°F | Ht 68.0 in | Wt 149.0 lb

## 2019-05-21 DIAGNOSIS — R112 Nausea with vomiting, unspecified: Secondary | ICD-10-CM | POA: Insufficient documentation

## 2019-05-21 DIAGNOSIS — R197 Diarrhea, unspecified: Secondary | ICD-10-CM | POA: Diagnosis not present

## 2019-05-21 DIAGNOSIS — R1013 Epigastric pain: Secondary | ICD-10-CM | POA: Insufficient documentation

## 2019-05-21 DIAGNOSIS — K920 Hematemesis: Secondary | ICD-10-CM

## 2019-05-21 NOTE — Patient Instructions (Signed)
1. Please have your labs today.  We will touch base next week with results as available.  Based on findings, we will determine next step in your work-up i.e. complete gallbladder work-up versus upper endoscopy.

## 2019-05-27 LAB — HEPATIC FUNCTION PANEL
AG Ratio: 2 (calc) (ref 1.0–2.5)
ALT: 13 U/L (ref 9–46)
AST: 11 U/L (ref 10–40)
Albumin: 4.7 g/dL (ref 3.6–5.1)
Alkaline phosphatase (APISO): 74 U/L (ref 36–130)
Bilirubin, Direct: 0.1 mg/dL (ref 0.0–0.2)
Globulin: 2.3 g/dL (ref 1.9–3.7)
Indirect Bilirubin: 0.4 mg/dL (ref 0.2–1.2)
Total Bilirubin: 0.5 mg/dL (ref 0.2–1.2)
Total Protein: 7 g/dL (ref 6.1–8.1)

## 2019-05-27 LAB — CBC WITH DIFFERENTIAL/PLATELET
Absolute Monocytes: 494 {cells}/uL (ref 200–950)
Basophils Absolute: 28 {cells}/uL (ref 0–200)
Basophils Relative: 0.6 %
Eosinophils Absolute: 259 {cells}/uL (ref 15–500)
Eosinophils Relative: 5.5 %
HCT: 45.4 % (ref 38.5–50.0)
Hemoglobin: 15.8 g/dL (ref 13.2–17.1)
Lymphs Abs: 1429 {cells}/uL (ref 850–3900)
MCH: 30.2 pg (ref 27.0–33.0)
MCHC: 34.8 g/dL (ref 32.0–36.0)
MCV: 86.8 fL (ref 80.0–100.0)
MPV: 10 fL (ref 7.5–12.5)
Monocytes Relative: 10.5 %
Neutro Abs: 2491 {cells}/uL (ref 1500–7800)
Neutrophils Relative %: 53 %
Platelets: 231 10*3/uL (ref 140–400)
RBC: 5.23 Million/uL (ref 4.20–5.80)
RDW: 11.9 % (ref 11.0–15.0)
Total Lymphocyte: 30.4 %
WBC: 4.7 10*3/uL (ref 3.8–10.8)

## 2019-05-27 LAB — ALPHA-GAL PANEL
Beef IgE: <0.1 kU/L (ref <0.35)
Class: 0
Class: 0
Class: 0
Galactose-alpha-1,3-galactose IgE: <0.1 kU/L (ref <0.10)
LAMB/MUTTON IGE: <0.1 kU/L (ref <0.35)
Pork IgE: <0.1 kU/L (ref <0.35)

## 2019-05-27 LAB — TISSUE TRANSGLUTAMINASE, IGA: (tTG) Ab, IgA: 1 U/mL

## 2019-05-27 LAB — IGA: Immunoglobulin A: 288 mg/dL (ref 47–310)

## 2019-05-31 ENCOUNTER — Telehealth: Payer: Self-pay | Admitting: Gastroenterology

## 2019-05-31 NOTE — Telephone Encounter (Signed)
Pt has labs done on 05/21/2019 and was checking on his results. Please call [phone]

## 2019-06-01 ENCOUNTER — Other Ambulatory Visit: Payer: Self-pay

## 2019-06-01 DIAGNOSIS — R1013 Epigastric pain: Secondary | ICD-10-CM

## 2019-06-01 DIAGNOSIS — R112 Nausea with vomiting, unspecified: Secondary | ICD-10-CM

## 2019-06-01 NOTE — Progress Notes (Unsigned)
nm

## 2019-06-01 NOTE — Telephone Encounter (Signed)
See results. Pt is aware.  

## 2019-06-25 ENCOUNTER — Other Ambulatory Visit (HOSPITAL_COMMUNITY): Payer: 59

## 2019-07-01 ENCOUNTER — Other Ambulatory Visit: Payer: Self-pay

## 2019-07-01 ENCOUNTER — Encounter (HOSPITAL_COMMUNITY)
Admission: RE | Admit: 2019-07-01 | Discharge: 2019-07-01 | Disposition: A | Discharge status: Home / Self Care | Payer: 59 | Source: Ambulatory Visit | Attending: Gastroenterology | Admitting: Gastroenterology

## 2019-07-01 DIAGNOSIS — R112 Nausea with vomiting, unspecified: Secondary | ICD-10-CM

## 2019-07-01 DIAGNOSIS — R1013 Epigastric pain: Secondary | ICD-10-CM | POA: Insufficient documentation

## 2019-07-01 MED ORDER — TECHNETIUM TC 99M MEBROFENIN IV KIT
5.0000 | PACK | Freq: Once | INTRAVENOUS | Status: AC | PRN
  Administered 2019-07-01: 5.2 via INTRAVENOUS

## 2020-03-01 DIAGNOSIS — U071 COVID-19: Secondary | ICD-10-CM | POA: Insufficient documentation

## 2020-05-18 DIAGNOSIS — Z20822 Contact with and (suspected) exposure to covid-19: Secondary | ICD-10-CM | POA: Insufficient documentation

## 2020-07-15 DIAGNOSIS — L03116 Cellulitis of left lower limb: Secondary | ICD-10-CM | POA: Insufficient documentation

## 2020-07-15 DIAGNOSIS — Z6824 Body mass index (BMI) 24.0-24.9, adult: Secondary | ICD-10-CM | POA: Insufficient documentation

## 2020-07-15 DIAGNOSIS — L818 Other specified disorders of pigmentation: Secondary | ICD-10-CM | POA: Insufficient documentation

## 2020-08-07 DIAGNOSIS — R002 Palpitations: Secondary | ICD-10-CM | POA: Insufficient documentation

## 2020-08-13 NOTE — Progress Notes (Signed)
Cardiology Office Note  Date: 08/14/2020   ID: Lawrence Whitehead, DOB [birth year], MRN [id number]  PCP:  Lovey Newcomer, PA  Cardiologist:  Prentice Docker, MD (Inactive) Electrophysiologist:  None   Chief Complaint: Cardiac follow up palpitations.  History of Present Illness: Lawrence Whitehead is a 25 y.o. male with a history of palpitations, chest pain.   Previous holter monitor with NSR with rare PAC's / PVC's. No significant arrhythmias . Echo Normal LVSF and diastolic function. PCP had prescribed prn Ativan.   Last seen by Dr Purvis Sheffield 11/12/2018. His chest pain and palpitations had reduced considerably in frequency and intensity.  He is here today after recently having been seen by his PCP on April 12 with recent complaints of palpitations.  He stated that palpitations have been going on for about a month or more.  Similar to issues he had back in 2020 when he was evaluated by cardiology.  History of drinking alcohol and caffeine at times.  History of vaping.  He stated the palpitations seem to get worse with vaping.  He is here today noting over the last month or so he is having palpitations between 5 to 8-10 times per day.  States he notices it mostly during the evening and nighttime.  He mentions recently it has awakened him from sleep.  States the palpitations are intermittent.  States sometimes they cause a little dizziness.  Denies any near syncopal or syncopal episodes associated.  Denies any chest pain or dyspnea when they occur.  Denies any PND or orthopnea.  He states he has stopped smoking and cut down on caffeine and alcohol.  States the palpitations seem somewhat better but they still occur.  Past Medical History:  Diagnosis Date  . Palpitations     Past Surgical History:  Procedure Laterality Date  . HERNIA REPAIR  01/2016    Current Outpatient Medications  Medication Sig Dispense Refill  . LORazepam (ATIVAN) 1 MG tablet Take 1 mg by mouth daily.     No  current facility-administered medications for this visit.   Allergies:  Patient has no known allergies.   Social History: The patient  reports that he has quit smoking. He has never used smokeless tobacco. He reports previous alcohol use. He reports previous drug use.   Family History: The patient's family history is not on file.   ROS:  Please see the history of present illness. Otherwise, complete review of systems is positive for none.  All other systems are reviewed and negative.   Physical Exam: VS:  BP 112/74   Pulse 81   Ht 5\' 9"  (1.753 m)   Wt 163 lb (73.9 kg)   SpO2 97%   BMI 24.07 kg/m , BMI Body mass index is 24.07 kg/m.  Wt Readings from Last 3 Encounters:  08/14/20 163 lb (73.9 kg)  05/21/19 149 lb (67.6 kg)  11/12/18 155 lb (70.3 kg)    General: Patient appears comfortable at rest. Neck: Supple, no elevated JVP or carotid bruits, no thyromegaly. Lungs: Clear to auscultation, nonlabored breathing at rest. Cardiac: Regular rate and rhythm, no S3 or significant systolic murmur, no pericardial rub. Extremities: No pitting edema, distal pulses 2+. Skin: Warm and dry. Musculoskeletal: No kyphosis. Neuropsychiatric: Alert and oriented x3, affect grossly appropriate.  ECG: EKG from PCP office on 08/07/2020 showed normal sinus rhythm possible left atrial abnormality rate of 66.  Normal axis, nonspecific ST abnormality/elevation  Recent Labwork: No results found for requested labs within  last 8760 hours.  No results found for: CHOL, TRIG, HDL, CHOLHDL, VLDL, LDLCALC, LDLDIRECT  Other Studies Reviewed Today:   Echocardiogram 09/29/2018 IMPRESSIONS  1. The left ventricle has normal systolic function with an ejection  fraction of 60-65%. The cavity size was normal. Left ventricular diastolic  parameters were normal. No evidence of left ventricular regional wall  motion abnormalities.  2. The right ventricle has normal systolic function. The cavity was  normal. There  is no increase in right ventricular wall thickness.  3. The mitral valve is grossly normal.  4. The aortic root is normal in size and structure.  5. The inferior vena cava was dilated in size with >50% respiratory  variability.   Assessment and Plan:  1. Chest pain, unspecified type   2. Anxiety   3. Palpitations    1. Chest pain, unspecified type Currently denies any chest pain.  2. Anxiety States he has issues with anxiety and takes Ativan as needed.  3. Palpitations Having recent complaints over the last month of increasing palpitations between 8-10 times per day.  Describes the palpitations as intermittent and short-lived.  Denies any associated presyncopal or syncopal episodes.  States he may have had some brief lightheadedness on 2 occasions.  Please place a 14-day ZIO XT monitor for evaluation.  Medication Adjustments/Labs and Tests Ordered: Current medicines are reviewed at length with the patient today.  Concerns regarding medicines are outlined above.   Disposition: Follow-up with Dr. Wyline Mood or APP 6 weeks  Signed, Rennis Harding, NP 08/14/2020 10:52 AM    [facility] [address] [place], [place], [place] [address] Phone: [phone]; Fax: [phone]

## 2020-08-14 ENCOUNTER — Ambulatory Visit (INDEPENDENT_AMBULATORY_CARE_PROVIDER_SITE_OTHER): Payer: 59 | Admitting: Family Medicine

## 2020-08-14 ENCOUNTER — Telehealth: Payer: Self-pay | Admitting: Family Medicine

## 2020-08-14 ENCOUNTER — Ambulatory Visit (INDEPENDENT_AMBULATORY_CARE_PROVIDER_SITE_OTHER): Payer: 59

## 2020-08-14 ENCOUNTER — Other Ambulatory Visit: Payer: Self-pay | Admitting: Family Medicine

## 2020-08-14 ENCOUNTER — Encounter: Payer: Self-pay | Admitting: Family Medicine

## 2020-08-14 ENCOUNTER — Other Ambulatory Visit: Payer: Self-pay

## 2020-08-14 VITALS — BP 112/74 | HR 81 | Ht 69.0 in | Wt 163.0 lb

## 2020-08-14 DIAGNOSIS — R079 Chest pain, unspecified: Secondary | ICD-10-CM

## 2020-08-14 DIAGNOSIS — R002 Palpitations: Secondary | ICD-10-CM

## 2020-08-14 DIAGNOSIS — F419 Anxiety disorder, unspecified: Secondary | ICD-10-CM

## 2020-08-14 NOTE — Telephone Encounter (Signed)
Pre-cert Verification for the following procedure    14 DAY ZIO    

## 2020-08-14 NOTE — Patient Instructions (Signed)
Your physician recommends that you schedule a follow-up appointment in: 6 WEEKS WITH [name], NP  Your physician recommends that you continue on your current medications as directed. Please refer to the Current Medication list given to you today.  14 DAY ZIO   Thank you for choosing [facility]!!

## 2020-09-13 ENCOUNTER — Telehealth: Payer: Self-pay | Admitting: *Deleted

## 2020-09-13 NOTE — Telephone Encounter (Signed)
-----   Message from Creig Hines, NP sent at 09/12/2020  4:03 PM EDT ----- Rare extra beats from the top and bottom chambers (<1% of beats). No significant arrhythmias to explain symptoms.

## 2020-09-14 ENCOUNTER — Encounter: Payer: Self-pay | Admitting: Physician Assistant

## 2020-09-14 NOTE — Progress Notes (Addendum)
Cardiology Office Note    Date:  09/15/2020   ID:  Lawrence Whitehead, DOB Apr 30, 1995, MRN [id number]  PCP:  Lovey Newcomer, PA  Cardiologist:  None  Electrophysiologist:  None   Chief Complaint: f/u palpitations  History of Present Illness:   Lawrence Whitehead is a 25 y.o. male with history of anxiety and palpitations (PACs/PVCs). He previously established with Dr. Purvis Sheffield for palpitations and atypical chest pain. Monitor 08/2018 showed NSR with rare PACs and PVCs. 2D echo 09/2018 showed EF 60-65%, normal diastolic parameters, dilated IVC. Chest pain was felt due to anxiety. He recently saw [name] back in follow-up continuing to report intermittent palpitations. Chest pain had improved. Repeat monitor 08/2020 showed average HR 85bpm (range 48 to 189), rare PACs/PVCs <1% and brief ventricular bigeminy.  He is seen back in follow-up continuing to report episodic palpitations. He sometimes feels them as an awareness of bounding pulse, a brief pause, or a quiver sensation. He tends to notice them at night or when playing video games. They make him feel anxious. He had one episode since last visit of brief atypical chest pain associated with tingling on the left side of his body when he was anxious. He has been able to exercise without angina. He does not feel that his anxiety has been well controlled (I.e. cites being very anxious in the weeks before this appointment). He quit smoking but now vapes. No family hx of CAD. Paternal grandmother had pacemaker.  Labwork independently reviewed: [facility] 08/07/20 Mg 2.3, TSH wnl, LFTs wnl, Cr 0.77, no potassium listed 04/2019 LFTs wnl, CBC wnl 2020 K 4.0, Na 145, LFTs wnl  Past Medical History:  Diagnosis Date  . Palpitations   . Premature atrial contractions   . PVC's (premature ventricular contractions)     Past Surgical History:  Procedure Laterality Date  . HERNIA REPAIR  01/2016    Current Medications: Current Meds  Medication Sig  .  busPIRone (BUSPAR) 5 MG tablet Take 5 mg by mouth 2 (two) times daily.  Marland Kitchen LORazepam (ATIVAN) 1 MG tablet Take 1 mg by mouth daily.   Allergies:   Patient has no known allergies.   Social History   Socioeconomic History  . Marital status: Single    Spouse name: Not on file  . Number of children: Not on file  . Years of education: Not on file  . Highest education level: Not on file  Occupational History  . Not on file  Tobacco Use  . Smoking status: Former Games developer  . Smokeless tobacco: Never Used  Vaping Use  . Vaping Use: Former  Substance and Sexual Activity  . Alcohol use: Not Currently    Comment: no heavy use  . Drug use: Not Currently  . Sexual activity: Not on file  Other Topics Concern  . Not on file  Social History Narrative  . Not on file   Social Determinants of Health   Financial Resource Strain: Not on file  Food Insecurity: Not on file  Transportation Needs: Not on file  Physical Activity: Not on file  Stress: Not on file  Social Connections: Not on file     Family History:  The patient's family history is negative for Colon cancer, Celiac disease, and Inflammatory bowel disease.  ROS:   Please see the history of present illness.  All other systems are reviewed and otherwise negative.    EKGs/Labs/Other Studies Reviewed:    Studies reviewed are outlined and summarized above.  Reports included below if pertinent.  2D Echo 09/2018 1. The left ventricle has normal systolic function with an ejection  fraction of 60-65%. The cavity size was normal. Left ventricular diastolic  parameters were normal. No evidence of left ventricular regional wall  motion abnormalities.  2. The right ventricle has normal systolic function. The cavity was  normal. There is no increase in right ventricular wall thickness.  3. The mitral valve is grossly normal.  4. The aortic root is normal in size and structure.  5. The inferior vena cava was dilated in size with >50%  respiratory  variability.   Zio Monitor 08/2020 ZIO XT reviewed.  13 days 19 hours analyzed.  Predominant rhythm is sinus with heart rate ranging from 48 bpm up to 189 bpm and average heart rate 85 bpm.  There were rare PACs and PVCs representing less than 1% total beats.  Brief ventricular bigeminy noted.  There were no sustained arrhythmias or pauses.    EKG:  EKG is ordered today, personally reviewed, demonstrating NSR 71bpm early ST upsloping precordial leads with tall volts. Likely normal for age. No change from prior tracing.  Recent Labs: No results found for requested labs within last 8760 hours.  Recent Lipid Panel No results found for: CHOL, TRIG, HDL, CHOLHDL, VLDL, LDLCALC, LDLDIRECT  PHYSICAL EXAM:    VS:  BP 116/74   Pulse 86   Ht 5\' 8"  (1.727 m)   Wt 163 lb (73.9 kg)   SpO2 97%   BMI 24.78 kg/m   BMI: Body mass index is 24.78 kg/m.  GEN: Well nourished, well developed male in no acute distress HEENT: normocephalic, atraumatic Neck: no JVD, carotid bruits, or masses Cardiac: RRR; no murmurs, rubs, or gallops, no edema  Respiratory:  clear to auscultation bilaterally, normal work of breathing GI: soft, nontender, nondistended, + BS MS: no deformity or atrophy Skin: warm and dry, no rash, tattoos Neuro:  Alert and Oriented x 3, Strength and sensation are intact, follows commands, no SI/HI Psych: euthymic mood, full affect  Wt Readings from Last 3 Encounters:  09/15/20 163 lb (73.9 kg)  08/14/20 163 lb (73.9 kg)  05/21/19 149 lb (67.6 kg)     ASSESSMENT & PLAN:   1. Palpitations likely due to PACs/PVCs - reviewed monitor with patient. Discussed benign nature of palpitations, but also offered trial of beta blocker for symptomatic alleviation. He is interested in trial of medical therapy for suppression. Will start Toprol 25mg  daily. Recent Mg, TSH reviewed and were normal in 07/2020. It looks like he had a full CMET at the time but potassium is not crossing over so  we will try and obtain a copy for review. We also discussed as an option to monitor at home as well as home BP follow-up. We did discuss the mind/body relationship of anxiety contributing to hypervigilence of symptoms as well. Advised patient to relay update of symptoms/med tolerance in 2 weeks.  2. History of atypical chest pain - EKG appears similar to prior and prior echo was reassuring. 2D monitor did not show any concerning arrhythmias. He is not having any typical symptoms to suggest cardiac etiology. Continue to monitor for any escalation or change in symptoms.   3. History of anxiety - as above, we discussed the importance of good control of anxiety with its relationship to heart health. He is on Buspar but may need to consider something like SSRI instead. Have recommended f/u with PCP.  Disposition: F/u in 1  year (establish with [facility]. cardiologist).  Medication Adjustments/Labs and Tests Ordered: Current medicines are reviewed at length with the patient today.  Concerns regarding medicines are outlined above. Medication changes, Labs and Tests ordered today are summarized above and listed in the Patient Instructions accessible in Encounters.    Signed, Laurann Montana, PA-C  09/15/2020 1:00 PM    [facility] - [place] Location in [facility] [address] [place], [place] [address] Ph: [phone]; Fax [phone]

## 2020-09-15 ENCOUNTER — Ambulatory Visit (INDEPENDENT_AMBULATORY_CARE_PROVIDER_SITE_OTHER): Payer: 59 | Admitting: Physician Assistant

## 2020-09-15 ENCOUNTER — Encounter: Payer: Self-pay | Admitting: Physician Assistant

## 2020-09-15 ENCOUNTER — Other Ambulatory Visit: Payer: Self-pay

## 2020-09-15 VITALS — BP 116/74 | HR 86 | Ht 68.0 in | Wt 163.0 lb

## 2020-09-15 DIAGNOSIS — R002 Palpitations: Secondary | ICD-10-CM

## 2020-09-15 DIAGNOSIS — R0789 Other chest pain: Secondary | ICD-10-CM

## 2020-09-15 DIAGNOSIS — I493 Ventricular premature depolarization: Secondary | ICD-10-CM

## 2020-09-15 DIAGNOSIS — I491 Atrial premature depolarization: Secondary | ICD-10-CM

## 2020-09-15 DIAGNOSIS — Z8659 Personal history of other mental and behavioral disorders: Secondary | ICD-10-CM

## 2020-09-15 MED ORDER — METOPROLOL SUCCINATE ER 25 MG PO TB24: 25.0000 mg | ORAL_TABLET | Freq: Every day | ORAL | 3 refills | Status: DC

## 2020-09-15 NOTE — Telephone Encounter (Signed)
-----   Message from Christopher Ronald Berge, NP sent at 09/12/2020  4:03 PM EDT ----- Rare extra beats from the top and bottom chambers (<1% of beats). No significant arrhythmias to explain symptoms. 

## 2020-09-15 NOTE — Telephone Encounter (Signed)
Pt contacted and verbalized understanding. Pt had no questions or concerns today. Pt confirmed appt with Ronie Spies, PA-C in the [place] office on 09/15/2020

## 2020-09-15 NOTE — Patient Instructions (Signed)
Medication Instructions:  our physician has recommended you make the following change in your medication:  START Toprol-XL 25 mg tablets daily  *If you need a refill on your cardiac medications before your next appointment, please call your pharmacy*   Lab Work: None If you have labs (blood work) drawn today and your tests are completely normal, you will receive your results only by: Marland Kitchen MyChart Message (if you have MyChart) OR . A paper copy in the mail If you have any lab test that is abnormal or we need to change your treatment, we will call you to review the results.   Testing/Procedures: None   Follow-Up: At [facility], you and your health needs are our priority.  As part of our continuing mission to provide you with exceptional heart care, we have created designated Provider Care Teams.  These Care Teams include your primary Cardiologist (physician) and Advanced Practice Providers (APPs -  Physician Assistants and Nurse Practitioners) who all work together to provide you with the care you need, when you need it.  We recommend signing up for the patient portal called "MyChart".  Sign up information is provided on this After Visit Summary.  MyChart is used to connect with patients for Virtual Visits (Telemedicine).  Patients are able to view lab/test results, encounter notes, upcoming appointments, etc.  Non-urgent messages can be sent to your provider as well.   To learn more about what you can do with MyChart, go to [email or web address].    Your next appointment:   1 year(s)  The format for your next appointment:   In Person  Provider:    Establish with MD    Other Instructions

## 2020-09-26 ENCOUNTER — Ambulatory Visit: Payer: 59 | Admitting: Family Medicine

## 2020-11-10 ENCOUNTER — Encounter: Payer: Self-pay | Admitting: Family Medicine

## 2020-11-10 ENCOUNTER — Ambulatory Visit (INDEPENDENT_AMBULATORY_CARE_PROVIDER_SITE_OTHER): Payer: 59 | Admitting: Family Medicine

## 2020-11-10 VITALS — BP 118/80 | HR 71 | Ht 69.0 in | Wt 165.8 lb

## 2020-11-10 DIAGNOSIS — R002 Palpitations: Secondary | ICD-10-CM | POA: Diagnosis not present

## 2020-11-10 DIAGNOSIS — Z8659 Personal history of other mental and behavioral disorders: Secondary | ICD-10-CM | POA: Diagnosis not present

## 2020-11-10 DIAGNOSIS — R0789 Other chest pain: Secondary | ICD-10-CM | POA: Diagnosis not present

## 2020-11-10 NOTE — Progress Notes (Signed)
Cardiology Office Note  Date: 11/10/2020   ID: Lawrence Whitehead, DOB Jul 23, 1995, MRN [id number]  PCP:  Lovey Newcomer, PA  Cardiologist:  None Electrophysiologist:  None   Chief Complaint: Atypical chest pains and palpitations.  History of Present Illness: Lawrence Whitehead is a 25 y.o. male with a history of palpitations, PACs, PVCs.  He was last seen by Lawrence Spies, PA-C on 09/15/2020 for palpitations/PVCs/PACs, atypical chest pain, history of anxiety. He continues to report episodic palpitations.  Sometimes an awareness of bounding pulses, bradycardia falls or quiver sensation.  Tends to notice them at night.  Tended to make him feel anxious.  Had 1 episode of brief atypical chest pain with associated tingling on the left side of his body when he was anxious.  He was able to exercise without angina.  He had stopped smoking but continued to vape.  Ms. Lawrence Whitehead stated palpitations likely due to PACs and PVCs after reviewing monitor results with patient.  She offered trial of beta-blocker for symptomatic alleviation.  Plan was to start Toprol 25 mg daily.  She stated EKG appeared similar to prior and prior echo was reassuring.  Monitor did not show any concerning arrhythmias.  Plan was to continue to monitor for escalation or change in symptoms.  She mentioned possibly needing to consider SSRI or for anxiety instead of BuSpar.  She recommended follow-up with PCP.  He was recently seen at [facility] on 10/26/2020 by Lawrence Potter, PA-C.  He was complaining of ongoing episodic chest pain with some palpitations.  He had previously worn a Zio patch with mostly benign findings, 1 brief episode of ventricular bigeminy.  He was started on Toprol-XL 25 mg daily.  He stated he had been doing better with palpitations after metoprolol but describes chest comfort as the current issue.  He described it as sharp pain on the upper left side of his chest.  Stated that sometimes felt like a heavy  feeling in his lower chest and epigastric area.  He stated the pain would usually last for about 10 minutes or less and will go away on its own.  He stated it was random.  He denied any pain on exertion or strenuous activities..  No DOE.  SOB.  He had recently stopped taking his BuSpar.  He was currently taking Ativan about once a week as needed.  He had stopped drinking caffeine and stopped vaping.  He described chest pain that is located diffusely rated a 4 on a 10 scale.  He is here per referral by [facility] for intermittent chest pain and palpitations.  When questioned about his previous complaints mentioned by provider at [facility], he does not characterize chest pain as the primary issue.  He describes more issues with palpitations when performing exertional activities.  The note by Mrs. Lawrence Potter PA-C mentioned updating his echo and plan to have him see cardiology again if symptoms did not improve or worsened.  When questioned about whether [facility] was planning on performing the echo or whether we were to perform the echo, he states thought he was here to to be examined and have an echocardiogram.  He states the palpitations are much better since starting Toprol.  He denies any recreational drug use.  States he stopped vaping/smoking.  He is taking Ativan as needed for anxiety.  He stopped his BuSpar.  He goes to the gym quite often and does very strenuous activities including using the stair climber and  getting his heart rate up to the 160s without any chest pain, pressure, tightness, neck, arm, back, or jaw pain.  States he does have palpitations on occasion with more strenuous activity.   Past Medical History:  Diagnosis Date   Palpitations    Premature atrial contractions    PVC's (premature ventricular contractions)     Past Surgical History:  Procedure Laterality Date   HERNIA REPAIR  01/2016    Current Outpatient Medications  Medication Sig Dispense Refill    busPIRone (BUSPAR) 5 MG tablet Take 5 mg by mouth 2 (two) times daily.     LORazepam (ATIVAN) 1 MG tablet Take 1 mg by mouth daily.     metoprolol succinate (TOPROL XL) 25 MG 24 hr tablet Take 1 tablet (25 mg total) by mouth daily. 90 tablet 3   No current facility-administered medications for this visit.   Allergies:  Patient has no known allergies.   Social History: The patient  reports that he has quit smoking. He has never used smokeless tobacco. He reports previous alcohol use. He reports previous drug use.   Family History: The patient's family history is not on file.   ROS:  Please see the history of present illness. Otherwise, complete review of systems is positive for none.  All other systems are reviewed and negative.   Physical Exam: VS:  BP 118/80   Pulse 71   Ht 5\' 9"  (1.753 m)   Wt 165 lb 12.8 oz (75.2 kg)   SpO2 97%   BMI 24.48 kg/m , BMI Body mass index is 24.48 kg/m.  Wt Readings from Last 3 Encounters:  11/10/20 165 lb 12.8 oz (75.2 kg)  09/15/20 163 lb (73.9 kg)  08/14/20 163 lb (73.9 kg)    General: Patient appears comfortable at rest. Neck: Supple, no elevated JVP or carotid bruits, no thyromegaly. Lungs: Clear to auscultation, nonlabored breathing at rest. Cardiac: Regular rate and rhythm, no S3 or significant systolic murmur, no pericardial rub. Extremities: No pitting edema, distal pulses 2+. Skin: Warm and dry. Musculoskeletal: No kyphosis. Neuropsychiatric: Alert and oriented x3, affect grossly appropriate.  ECG:    Recent Labwork: No results found for requested labs within last 8760 hours.  No results found for: CHOL, TRIG, HDL, CHOLHDL, VLDL, LDLCALC, LDLDIRECT  Other Studies Reviewed Today:  2D Echo 11-04-2018  1. The left ventricle has normal systolic function with an ejection  fraction of 60-65%. The cavity size was normal. Left ventricular diastolic  parameters were normal. No evidence of left ventricular regional wall  motion  abnormalities.   2. The right ventricle has normal systolic function. The cavity was  normal. There is no increase in right ventricular wall thickness.   3. The mitral valve is grossly normal.   4. The aortic root is normal in size and structure.   5. The inferior vena cava was dilated in size with >50% respiratory  variability.   Zio Monitor 08/2020 ZIO XT reviewed.  13 days 19 hours analyzed.  Predominant rhythm is sinus with heart rate ranging from 48 bpm up to 189 bpm and average heart rate 85 bpm.  There were rare PACs and PVCs representing less than 1% total beats.  Brief ventricular bigeminy noted.  There were no sustained arrhythmias or pauses.  Assessment and Plan:  1. Palpitations   2. Atypical chest pain   3. History of anxiety    1. Palpitations Patient states he has palpitations but they are much better controlled since starting  Toprol. States he might have palpitations once or twice a day which are short-lived.  Palpitations can increase with strenuous activity at times.  He states this is not consistent occurrence.  He states palpitations can happen at random.  He is continuing Toprol 25 mg daily.  He stopped drinking caffeine and stopped vaping.  2. Atypical chest pain He was recently seen by provider at [facility] who mentioned he was having episodic chest pain.  She mention this chest discomfort was what was bothering him.  He described it as sharp on the upper left side of the chest stating it sometimes felt like a heavy feeling in his lower chest and upper epigastric area.  She stated the pain or discomfort usually last for about 10 minutes and would go away on its own.  She described it is random.  He did not get any pain with exertion or strenuous activity.  No SOB or DOE.  Please get follow-up echocardiogram per PCP request.  3. History of anxiety He was taking BuSpar for anxiety.  He is also taking as needed Ativan about once weekly as needed.  Per PCP  anxiety was improved.  Medication Adjustments/Labs and Tests Ordered: Current medicines are reviewed at length with the patient today.  Concerns regarding medicines are outlined above.   Disposition: Follow-up with Dr. Wyline Mood or APP 6 to 8 weeks  Signed, Rennis Harding, NP 11/10/2020 5:31 PM    [facility] at [facility] [address] [place], [place], [place] [address] Phone: [phone]; Fax: [phone]

## 2020-11-10 NOTE — Patient Instructions (Signed)
Medication Instructions:  Continue all current medications.  Labwork: none  Testing/Procedures:  Your physician has requested that you have an echocardiogram. Echocardiography is a painless test that uses sound waves to create images of your heart. It provides your doctor with information about the size and shape of your heart and how well your heart's chambers and valves are working. This procedure takes approximately one hour. There are no restrictions for this procedure.  Office will contact with results via phone or letter.    Follow-Up: 6-8 weeks   Any Other Special Instructions Will Be Listed Below (If Applicable).  If you need a refill on your cardiac medications before your next appointment, please call your pharmacy.  

## 2020-11-23 ENCOUNTER — Other Ambulatory Visit: Payer: Self-pay

## 2020-11-23 ENCOUNTER — Ambulatory Visit (HOSPITAL_COMMUNITY)
Admission: RE | Admit: 2020-11-23 | Discharge: 2020-11-23 | Disposition: A | Discharge status: Home / Self Care | Payer: 59 | Source: Ambulatory Visit | Attending: Family Medicine | Admitting: Family Medicine

## 2020-11-23 DIAGNOSIS — R002 Palpitations: Secondary | ICD-10-CM | POA: Diagnosis not present

## 2020-11-23 DIAGNOSIS — R0789 Other chest pain: Secondary | ICD-10-CM | POA: Insufficient documentation

## 2020-11-23 LAB — ECHOCARDIOGRAM COMPLETE
Area-P 1/2: 4.93 cm2
S' Lateral: 3.2 cm

## 2020-11-23 NOTE — Progress Notes (Signed)
*  PRELIMINARY RESULTS* Echocardiogram 2D Echocardiogram has been performed.  Stacey Drain 11/23/2020, 9:18 AM

## 2020-11-27 ENCOUNTER — Other Ambulatory Visit (HOSPITAL_COMMUNITY): Payer: 59

## 2020-11-27 ENCOUNTER — Telehealth: Payer: Self-pay | Admitting: *Deleted

## 2020-11-27 NOTE — Telephone Encounter (Signed)
-----   Message from Netta Neat., NP sent at 11/26/2020  7:48 PM EDT ----- Please call the patient and let him know the pumping function of his heart is normal.  No structural abnormalities or leaking valves.  Everything looks good   Netta Neat, NP  11/26/2020 7:41 PM

## 2020-11-27 NOTE — Telephone Encounter (Signed)
Lesle Chris, LPN  09/02/1535 94:32 AM EDT Back to Top     Notified, copy to pcp.

## 2020-12-21 NOTE — Progress Notes (Signed)
Cardiology Office Note  Date: 12/22/2020   ID: Lawrence Whitehead, DOB [birth year], MRN [id number]  PCP:  Lovey Newcomer, PA  Cardiologist:  None Electrophysiologist:  None   Chief Complaint: Atypical chest pains and palpitations.  History of Present Illness: Lawrence Whitehead is a 25 y.o. male with a history of palpitations, PACs, PVCs.  He was last seen by Ronie Spies, PA-C on 09/15/2020 for palpitations/PVCs/PACs, atypical chest pain, history of anxiety. He continues to report episodic palpitations.  Sometimes an awareness of bounding pulses, bradycardia falls or quiver sensation.  Tends to notice them at night.  Tended to make him feel anxious.  Had 1 episode of brief atypical chest pain with associated tingling on the left side of his body when he was anxious.  He was able to exercise without angina.  He had stopped smoking but continued to vape.  Ms. Shea Evans stated palpitations likely due to PACs and PVCs after reviewing monitor results with patient.  She offered trial of beta-blocker for symptomatic alleviation.  Plan was to start Toprol 25 mg daily.  She stated EKG appeared similar to prior and prior echo was reassuring.  Monitor did not show any concerning arrhythmias.  Plan was to continue to monitor for escalation or change in symptoms.  She mentioned possibly needing to consider SSRI or for anxiety instead of BuSpar.  She recommended follow-up with PCP.  He was recently seen at [facility] on 10/26/2020 by Adline Potter, PA-C.  He was complaining of ongoing episodic chest pain with some palpitations.  He had previously worn a Zio patch with mostly benign findings, 1 brief episode of ventricular bigeminy.  He was started on Toprol-XL 25 mg daily.  He stated he had been doing better with palpitations after metoprolol but describes chest comfort as the current issue.  He described it as sharp pain on the upper left side of his chest.  Stated that sometimes felt like a heavy  feeling in his lower chest and epigastric area.  He stated the pain would usually last for about 10 minutes or less and will go away on its own.  He stated it was random.  He denied any pain on exertion or strenuous activities..  No DOE.  SOB.  He had recently stopped taking his BuSpar.  He was currently taking Ativan about once a week as needed.  He had stopped drinking caffeine and stopped vaping.  He described chest pain that is located diffusely rated a 4 on a 10 scale.  He was last here per referral by [facility] for intermittent chest pain and palpitations.  When questioned about his previous complaints mentioned by provider at [facility], he did not characterize chest pain as the primary issue.  Described more issues with palpitations when performing exertional activities.  The note by Mrs. Adline Potter PA-C mentioned updating his echo and plan to have him see cardiology again if symptoms did not improve or worsened.  When questioned about whether [facility] was planning on performing the echo or whether we were to perform the echo, he states thought he was here to to be examined and have an echocardiogram.  He states the palpitations are much better since starting Toprol.  He denies any recreational drug use.  He stopped vaping/smoking.  He was taking Ativan as needed for anxiety.  He stopped his BuSpar.  He goes to the gym quite often and does very strenuous activities including using the stair climber and getting  his heart rate up to the 160s without any chest pain, pressure, tightness, neck, arm, back, or jaw pain.  He stated he did have palpitations on occasion with more strenuous activity.  He is back for follow-up today for recent echocardiogram. Echocardiogram demonstrated EF of 60 to 65%.  No WMA's.  Normal diastolic parameters.  He denies any anginal symptoms.  States he has occasional sensation of band like sensation around his lower chest and the mid epigastric region without any  radiation or associated symptoms.  Still has occasional palpitations but much improved on Toprol.  Denies any orthostatic symptoms, CVA or TIA-like symptoms, PND, orthopnea, bleeding, claudication-like symptoms, DVT or PE-like symptoms, or lower extremity edema.    Past Medical History:  Diagnosis Date   Palpitations    Premature atrial contractions    PVC's (premature ventricular contractions)     Past Surgical History:  Procedure Laterality Date   HERNIA REPAIR  01/2016    Current Outpatient Medications  Medication Sig Dispense Refill   busPIRone (BUSPAR) 5 MG tablet Take 5 mg by mouth 2 (two) times daily. (Patient not taking: Reported on 12/22/2020)     LORazepam (ATIVAN) 1 MG tablet Take 1 mg by mouth as needed.     metoprolol succinate (TOPROL XL) 25 MG 24 hr tablet Take 1 tablet (25 mg total) by mouth daily. 90 tablet 3   No current facility-administered medications for this visit.   Allergies:  Patient has no known allergies.   Social History: The patient  reports that he has quit smoking. He has never used smokeless tobacco. He reports that he does not currently use alcohol. He reports that he does not currently use drugs.   Family History: The patient's family history is not on file.   ROS:  Please see the history of present illness. Otherwise, complete review of systems is positive for none.  All other systems are reviewed and negative.   Physical Exam: VS:  BP 115/80   Pulse 98   Ht 5\' 9"  (1.753 m)   Wt 164 lb 9.6 oz (74.7 kg)   SpO2 96%   BMI 24.31 kg/m , BMI Body mass index is 24.31 kg/m.  Wt Readings from Last 3 Encounters:  12/22/20 164 lb 9.6 oz (74.7 kg)  11/10/20 165 lb 12.8 oz (75.2 kg)  09/15/20 163 lb (73.9 kg)    General: Patient appears comfortable at rest. Neck: Supple, no elevated JVP or carotid bruits, no thyromegaly. Lungs: Clear to auscultation, nonlabored breathing at rest. Cardiac: Regular rate and rhythm, no S3 or significant systolic  murmur, no pericardial rub. Extremities: No pitting edema, distal pulses 2+. Skin: Warm and dry. Musculoskeletal: No kyphosis. Neuropsychiatric: Alert and oriented x3, affect grossly appropriate.  ECG:    Recent Labwork: No results found for requested labs within last 8760 hours.  No results found for: CHOL, TRIG, HDL, CHOLHDL, VLDL, LDLCALC, LDLDIRECT  Other Studies Reviewed Today:   Echocardiogram 11/23/2020  1. Left ventricular ejection fraction, by estimation, is 60 to 65%. The  left ventricle has normal function. The left ventricle has no regional  wall motion abnormalities. Left ventricular diastolic parameters were  normal.   2. Right ventricular systolic function is normal. The right ventricular  size is normal. Tricuspid regurgitation signal is inadequate for assessing  PA pressure.   3. The mitral valve is normal in structure. No evidence of mitral valve  regurgitation. No evidence of mitral stenosis.   4. The aortic valve is tricuspid. Aortic  valve regurgitation is not  visualized. No aortic stenosis is present.   5. The inferior vena cava is dilated in size with >50% respiratory  variability, suggesting right atrial pressure of 8 mmHg.   2D Echo 09/2018  1. The left ventricle has normal systolic function with an ejection  fraction of 60-65%. The cavity size was normal. Left ventricular diastolic  parameters were normal. No evidence of left ventricular regional wall  motion abnormalities.   2. The right ventricle has normal systolic function. The cavity was  normal. There is no increase in right ventricular wall thickness.   3. The mitral valve is grossly normal.   4. The aortic root is normal in size and structure.   5. The inferior vena cava was dilated in size with >50% respiratory  variability.   Zio Monitor 08/2020 ZIO XT reviewed.  13 days 19 hours analyzed.  Predominant rhythm is sinus with heart rate ranging from 48 bpm up to 189 bpm and average heart rate  85 bpm.  There were rare PACs and PVCs representing less than 1% total beats.  Brief ventricular bigeminy noted.  There were no sustained arrhythmias or pauses.  Assessment and Plan:  1. Palpitations   2. Atypical chest pain   3. History of anxiety     1. Palpitations Patient states he has palpitations but they are much better controlled since starting Toprol. .  He states this is not consistent occurrence.  He states palpitations can happen at random.  He is continuing Toprol 25 mg daily.  He stopped drinking caffeine and stopped vaping.  2. Atypical chest pain He describes an occasional bandlike tightness/pain around the lower chest at times.  He denies any aggravating or alleviating factors.  States it can happen at random.  Denies any radiation or associated symptoms.  Recent echocardiogram was normal.  See report above.  Echocardiogram was performed at PCP request   3. History of anxiety He was taking BuSpar for anxiety.  He is also taking as needed Ativan about once weekly as needed.  Per PCP anxiety was improved.  Medication Adjustments/Labs and Tests Ordered: Current medicines are reviewed at length with the patient today.  Concerns regarding medicines are outlined above.   Disposition: Follow-up with Dr. Wyline Mood or APP 6 months  Signed, Rennis Harding, NP 12/22/2020 10:21 AM    [facility] [place] [facility] at [facility] [address] [place], [place], [place] [address] Phone: [phone]; Fax: [phone]

## 2020-12-22 ENCOUNTER — Encounter: Payer: Self-pay | Admitting: Family Medicine

## 2020-12-22 ENCOUNTER — Other Ambulatory Visit: Payer: Self-pay

## 2020-12-22 ENCOUNTER — Ambulatory Visit (INDEPENDENT_AMBULATORY_CARE_PROVIDER_SITE_OTHER): Payer: 59 | Admitting: Family Medicine

## 2020-12-22 VITALS — BP 115/80 | HR 98 | Ht 69.0 in | Wt 164.6 lb

## 2020-12-22 DIAGNOSIS — R0789 Other chest pain: Secondary | ICD-10-CM

## 2020-12-22 DIAGNOSIS — R002 Palpitations: Secondary | ICD-10-CM | POA: Diagnosis not present

## 2020-12-22 DIAGNOSIS — Z8659 Personal history of other mental and behavioral disorders: Secondary | ICD-10-CM

## 2020-12-22 NOTE — Patient Instructions (Signed)
Medication Instructions:  Continue all current medications.   Labwork: none  Testing/Procedures: none  Follow-Up: 6 months   Any Other Special Instructions Will Be Listed Below (If Applicable).   If you need a refill on your cardiac medications before your next appointment, please call your pharmacy.  

## 2021-06-11 IMAGING — NM NM HEPATO W/GB/PHARM/[PERSON_NAME]
2 series · 12 of 12 positions shown · non-contrast
Comparison: None

CLINICAL DATA: Unspecified abdominal pain, nausea, vomiting

EXAM:
NUCLEAR MEDICINE HEPATOBILIARY IMAGING WITH GALLBLADDER EF
TECHNIQUE: Sequential images of the abdomen were obtained [DATE] minutes
following intravenous administration of radiopharmaceutical. After
oral ingestion of Ensure, gallbladder ejection fraction was
determined. At 60 min, normal ejection fraction is greater than 33%.
RADIOPHARMACEUTICALS:  5.2 mCi 8c-MMm  Choletec IV

[Series 1: biliary · 3.25mm/px · 6 of 60 frames shown]
[frame 6/60]
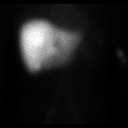
[frame 16/60]
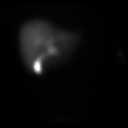
[frame 26/60]
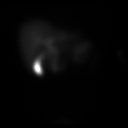
[frame 36/60]
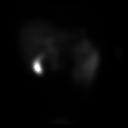
[frame 46/60]
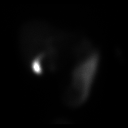
[frame 56/60]
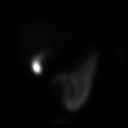

[Series 2: gbef · 3.25mm/px · 6 of 60 frames shown]
[frame 6/60]
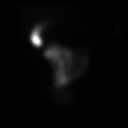
[frame 16/60]
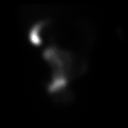
[frame 26/60]
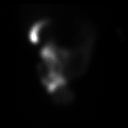
[frame 36/60]
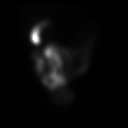
[frame 46/60]
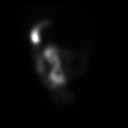
[frame 56/60]
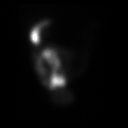

[12 of 12 positions shown; findings below may reference images not displayed]

FINDINGS: Normal tracer extraction from bloodstream indicating normal
hepatocellular function.

Normal excretion of tracer into biliary tree.

Gallbladder visualized at 10 min.

Small bowel visualized at 23 min.

No hepatic retention of tracer.

Subjectively normal emptying of tracer from gallbladder following
fatty meal stimulation.

Calculated gallbladder ejection fraction is 40%, normal.

Patient reported no symptoms following Ensure ingestion.

Normal gallbladder ejection fraction following Ensure ingestion is
greater than 33% at 1 hour.
IMPRESSION: Normal exam.

## 2021-06-15 ENCOUNTER — Other Ambulatory Visit: Payer: Self-pay | Admitting: Physician Assistant

## 2021-07-24 DIAGNOSIS — I10 Essential (primary) hypertension: Secondary | ICD-10-CM | POA: Diagnosis not present

## 2021-07-24 DIAGNOSIS — Z6826 Body mass index (BMI) 26.0-26.9, adult: Secondary | ICD-10-CM | POA: Diagnosis not present

## 2021-07-24 DIAGNOSIS — Z202 Contact with and (suspected) exposure to infections with a predominantly sexual mode of transmission: Secondary | ICD-10-CM | POA: Diagnosis not present

## 2021-07-24 DIAGNOSIS — R69 Illness, unspecified: Secondary | ICD-10-CM | POA: Diagnosis not present

## 2021-07-24 DIAGNOSIS — K4020 Bilateral inguinal hernia, without obstruction or gangrene, not specified as recurrent: Secondary | ICD-10-CM | POA: Diagnosis not present

## 2021-09-06 ENCOUNTER — Other Ambulatory Visit: Payer: Self-pay | Admitting: General Surgery

## 2021-09-06 DIAGNOSIS — K4020 Bilateral inguinal hernia, without obstruction or gangrene, not specified as recurrent: Secondary | ICD-10-CM

## 2021-09-07 ENCOUNTER — Encounter: Payer: Self-pay | Admitting: Cardiology

## 2021-09-07 ENCOUNTER — Ambulatory Visit: Payer: 59 | Admitting: Cardiology

## 2021-09-07 VITALS — BP 128/84 | HR 95 | Ht 68.0 in | Wt 164.4 lb

## 2021-09-07 DIAGNOSIS — R69 Illness, unspecified: Secondary | ICD-10-CM | POA: Diagnosis not present

## 2021-09-07 DIAGNOSIS — R002 Palpitations: Secondary | ICD-10-CM

## 2021-09-07 DIAGNOSIS — F419 Anxiety disorder, unspecified: Secondary | ICD-10-CM | POA: Diagnosis not present

## 2021-09-07 NOTE — Assessment & Plan Note (Signed)
PACs PVCs.  Zio patch monitor reviewed as above.  Currently taking low-dose Toprol but sometimes skips a dose.  I am fine with him cutting his Toprol in half to 12.5 mg and seeing how he feels with this.  I am also fine with him trying to stop the medication as well.  His PACs and PVCs are benign.  Reassurance has been given.  He does drink coffee and some other caffeine.  We did relate that this can increase the symptoms.  Overall echocardiogram was also reassuring. ? ?Once again, if he would like to try to wean off of the Toprol 25 I am comfortable with this.  He can let us know how he is doing in the future.  We will see him back as needed. ?

## 2021-09-07 NOTE — Patient Instructions (Signed)
Medication Instructions:  ?Your physician recommends that you continue on your current medications as directed. Please refer to the Current Medication list given to you today. ? ? ?Labwork: ?None ? ?Testing/Procedures: ?None ? ?Follow-Up: ?Follow up with Dr. Marlou Porch as needed.  ? ?Any Other Special Instructions Will Be Listed Below (If Applicable). ? ? ? ? ?If you need a refill on your cardiac medications before your next appointment, please call your pharmacy. ? ?

## 2021-09-07 NOTE — Assessment & Plan Note (Signed)
Tried BuSpar in the past but it actually made him more anxious.  Occasionally he will take an Ativan maybe once every 2 weeks he states.  This also helps with his palpitations. ?

## 2021-09-07 NOTE — Progress Notes (Signed)
?Cardiology Office Note:   ? ?Date:  09/07/2021  ? ?ID:  Lawrence Whitehead, DOB October 14, 1995, MRN [id number] ? ?PCP:  Royann Shivers, PA-C ?  ?[facility] Providers ?Cardiologist:  None    ? ?Referring MD: Lovey Newcomer, PA  ? ? ?History of Present Illness:   ? ?Lawrence Whitehead is a 26 y.o. male here for the follow-up of palpitations, PVCs PACs. ? ?He states that he has been feeling better.  Less sensation of palpitations.  No syncope no chest pain either.  Very rare to feel the symptoms.  He would like to try to come off of the medication, Toprol if possible.  This seems reasonable as a taper. ? ?Prior notes reviewed as followed: ?atypical chest pain, history of anxiety. ?He continues to report episodic palpitations.  Sometimes an awareness of bounding pulses, bradycardia falls or quiver sensation.  Tends to notice them at night.  Tended to make him feel anxious.  Had 1 episode of brief atypical chest pain with associated tingling on the left side of his body when he was anxious.  He was able to exercise without angina.  He had stopped smoking but continued to vape. ?  ?Ms. Dunn stated palpitations likely due to PACs and PVCs after reviewing monitor results with patient.  She offered trial of beta-blocker for symptomatic alleviation.  Plan was to start Toprol 25 mg daily.  She stated EKG appeared similar to prior and prior echo was reassuring.  Monitor did not show any concerning arrhythmias.  Plan was to continue to monitor for escalation or change in symptoms.  She mentioned possibly needing to consider SSRI or for anxiety instead of BuSpar.  She recommended follow-up with PCP. ?  ?He was recently seen at [facility] on 10/26/2020 by Adline Potter, PA-C.  He was complaining of ongoing episodic chest pain with some palpitations.  He had previously worn a Zio patch with mostly benign findings, 1 brief episode of ventricular bigeminy.  He was started on Toprol-XL 25 mg daily.  He stated he had  been doing better with palpitations after metoprolol but describes chest comfort as the current issue.  He described it as sharp pain on the upper left side of his chest.  Stated that sometimes felt like a heavy feeling in his lower chest and epigastric area.  He stated the pain would usually last for about 10 minutes or less and will go away on its own.  He stated it was random.  He denied any pain on exertion or strenuous activities..  No DOE.  SOB.  He had recently stopped taking his BuSpar.  He was currently taking Ativan about once a week as needed.  He had stopped drinking caffeine and stopped vaping.  He described chest pain that is located diffusely rated a 4 on a 10 scale. ?  ?He was last here per referral by [facility] for intermittent chest pain and palpitations.  When questioned about his previous complaints mentioned by provider at [facility], he did not characterize chest pain as the primary issue.  Described more issues with palpitations when performing exertional activities.  The note by Mrs. Adline Potter PA-C mentioned updating his echo and plan to have him see cardiology again if symptoms did not improve or worsened.  When questioned about whether [facility] was planning on performing the echo or whether we were to perform the echo, he states thought he was here to to be examined and have an echocardiogram.  He states the palpitations are much better since starting Toprol.  He denies any recreational drug use.  He stopped vaping/smoking.  He was taking Ativan as needed for anxiety.  He stopped his BuSpar.  He goes to the gym quite often and does very strenuous activities including using the stair climber and getting his heart rate up to the 160s without any chest pain, pressure, tightness, neck, arm, back, or jaw pain.  He stated he did have palpitations on occasion with more strenuous activity. ?  ?He is back for follow-up today for recent echocardiogram. Echocardiogram demonstrated  EF of 60 to 65%.  No WMA's.  Normal diastolic parameters.  He denies any anginal symptoms.  States he has occasional sensation of band like sensation around his lower chest and the mid epigastric region without any radiation or associated symptoms.  Still has occasional palpitations but much improved on Toprol.  Denies any orthostatic symptoms, CVA or TIA-like symptoms, PND, orthopnea, bleeding, claudication-like symptoms, DVT or PE-like symptoms, or lower extremity edema.  ?  ?  ? ?Past Medical History:  ?Diagnosis Date  ? Palpitations   ? Premature atrial contractions   ? PVC's (premature ventricular contractions)   ? ? ?Past Surgical History:  ?Procedure Laterality Date  ? HERNIA REPAIR  01/2016  ? ? ?Current Medications: ?Current Meds  ?Medication Sig  ? LORazepam (ATIVAN) 1 MG tablet Take 1 mg by mouth as needed.  ? metoprolol succinate (TOPROL-XL) 25 MG 24 hr tablet TAKE ONE TABLET BY MOUTH ONCE DAILY  ?  ? ?Allergies:   Patient has no known allergies.  ? ?Social History  ? ?Socioeconomic History  ? Marital status: Single  ?  Spouse name: Not on file  ? Number of children: Not on file  ? Years of education: Not on file  ? Highest education level: Not on file  ?Occupational History  ? Not on file  ?Tobacco Use  ? Smoking status: Former  ? Smokeless tobacco: Never  ?Vaping Use  ? Vaping Use: Former  ?Substance and Sexual Activity  ? Alcohol use: Yes  ?  Comment: no heavy use  ? Drug use: Not Currently  ? Sexual activity: Not on file  ?Other Topics Concern  ? Not on file  ?Social History Narrative  ? Not on file  ? ?Social Determinants of Health  ? ?Financial Resource Strain: Not on file  ?Food Insecurity: Not on file  ?Transportation Needs: Not on file  ?Physical Activity: Not on file  ?Stress: Not on file  ?Social Connections: Not on file  ?  ? ?Family History: ?The patient's family history includes Hypertension in his father. There is no history of Colon cancer, Celiac disease, or Inflammatory bowel  disease. ? ?ROS:   ?Please see the history of present illness.    ? All other systems reviewed and are negative. ? ?EKGs/Labs/Other Studies Reviewed:   ? ?The following studies were reviewed today: ?Echocardiogram 11/23/2020 ? 1. Left ventricular ejection fraction, by estimation, is 60 to 65%. The  ?left ventricle has normal function. The left ventricle has no regional  ?wall motion abnormalities. Left ventricular diastolic parameters were  ?normal.  ? 2. Right ventricular systolic function is normal. The right ventricular  ?size is normal. Tricuspid regurgitation signal is inadequate for assessing  ?PA pressure.  ? 3. The mitral valve is normal in structure. No evidence of mitral valve  ?regurgitation. No evidence of mitral stenosis.  ? 4. The aortic valve is tricuspid. Aortic valve regurgitation  is not  ?visualized. No aortic stenosis is present.  ? 5. The inferior vena cava is dilated in size with >50% respiratory  ?variability, suggesting right atrial pressure of 8 mmHg. ?  ?  ?2D Echo 09/2018 ? 1. The left ventricle has normal systolic function with an ejection  ?fraction of 60-65%. The cavity size was normal. Left ventricular diastolic  ?parameters were normal. No evidence of left ventricular regional wall  ?motion abnormalities.  ? 2. The right ventricle has normal systolic function. The cavity was  ?normal. There is no increase in right ventricular wall thickness.  ? 3. The mitral valve is grossly normal.  ? 4. The aortic root is normal in size and structure.  ? 5. The inferior vena cava was dilated in size with >50% respiratory  ?variability. ?  ?Zio Monitor 08/2020 ?ZIO XT reviewed.  13 days 19 hours analyzed.  Predominant rhythm is sinus with heart rate ranging from 48 bpm up to 189 bpm and average heart rate 85 bpm.  There were rare PACs and PVCs representing less than 1% total beats.  Brief ventricular bigeminy noted.  There were no sustained arrhythmias or pauses. ?  ? ?EKG:  EKG is ordered today.  The  ekg ordered today demonstrates sinus rhythm 95 no other changes. ? ?Recent Labs: ?No results found for requested labs within last 8760 hours.  ?Recent Lipid Panel ?No results found for: CHOL, TRIG, HDL, CHOLHDL, V

## 2021-09-12 ENCOUNTER — Ambulatory Visit
Admission: RE | Admit: 2021-09-12 | Discharge: 2021-09-12 | Disposition: A | Discharge status: Home / Self Care | Payer: 59 | Source: Ambulatory Visit | Attending: General Surgery | Admitting: General Surgery

## 2021-09-12 DIAGNOSIS — K4020 Bilateral inguinal hernia, without obstruction or gangrene, not specified as recurrent: Secondary | ICD-10-CM | POA: Diagnosis not present

## 2021-09-19 ENCOUNTER — Other Ambulatory Visit: Payer: Self-pay | Admitting: Surgery

## 2021-09-19 DIAGNOSIS — K4020 Bilateral inguinal hernia, without obstruction or gangrene, not specified as recurrent: Secondary | ICD-10-CM

## 2021-09-19 DIAGNOSIS — R1031 Right lower quadrant pain: Secondary | ICD-10-CM

## 2021-09-25 ENCOUNTER — Ambulatory Visit
Admission: RE | Admit: 2021-09-25 | Discharge: 2021-09-25 | Disposition: A | Discharge status: Home / Self Care | Payer: 59 | Source: Ambulatory Visit | Attending: Surgery | Admitting: Surgery

## 2021-09-25 DIAGNOSIS — R1031 Right lower quadrant pain: Secondary | ICD-10-CM

## 2021-09-25 DIAGNOSIS — K4020 Bilateral inguinal hernia, without obstruction or gangrene, not specified as recurrent: Secondary | ICD-10-CM | POA: Diagnosis not present

## 2021-09-25 MED ORDER — IOPAMIDOL (ISOVUE-300) INJECTION 61%
100.0000 mL | Freq: Once | INTRAVENOUS | Status: AC | PRN
  Administered 2021-09-25: 100 mL via INTRAVENOUS

## 2021-09-28 DIAGNOSIS — R1032 Left lower quadrant pain: Secondary | ICD-10-CM | POA: Diagnosis not present

## 2021-09-28 DIAGNOSIS — R1031 Right lower quadrant pain: Secondary | ICD-10-CM | POA: Diagnosis not present

## 2021-10-19 DIAGNOSIS — R1032 Left lower quadrant pain: Secondary | ICD-10-CM | POA: Diagnosis not present

## 2021-10-19 DIAGNOSIS — R1031 Right lower quadrant pain: Secondary | ICD-10-CM | POA: Diagnosis not present

## 2022-01-12 DIAGNOSIS — R69 Illness, unspecified: Secondary | ICD-10-CM | POA: Diagnosis not present

## 2022-01-14 DIAGNOSIS — R69 Illness, unspecified: Secondary | ICD-10-CM | POA: Diagnosis not present

## 2022-01-21 DIAGNOSIS — R69 Illness, unspecified: Secondary | ICD-10-CM | POA: Diagnosis not present

## 2022-01-28 DIAGNOSIS — R69 Illness, unspecified: Secondary | ICD-10-CM | POA: Diagnosis not present

## 2022-02-03 NOTE — Progress Notes (Signed)
Patient: Lawrence Whitehead  Service Category: E/M  Provider: Gaspar Cola, MD  DOB: Nov 07, 1995  DOS: 02/06/2022  Referring Provider: Ralene Ok, MD  MRN: [id number]  Setting: Ambulatory outpatient  PCP: Rosalee Kaufman, PA-C  Type: New Patient  Specialty: Interventional Pain Management    Location: Office  Delivery: Face-to-face     Primary Reason(s) for Visit: Encounter for initial evaluation of one or more chronic problems (new to examiner) potentially causing chronic pain, and posing a threat to normal musculoskeletal function. (Level of risk: High) CC: Groin Pain (both)  HPI  Lawrence Whitehead is a 26 y.o. year old, male patient, who comes for the first time to our practice referred by Ralene Ok, MD for our initial evaluation of his chronic pain. He has Nausea without vomiting; Abdominal pain, epigastric; Diarrhea; Hematemesis; Intermittent palpitations; Anxiety; Abdominal pain, generalized; Acute bronchitis; Acute sinusitis; Acute tonsillitis; Body mass index (BMI) 24.0-24.9, adult; Body mass index between 19-24, adult; Body mass index, pediatric, 5th percentile to less than 85th percentile for age; Cellulitis of left foot; Chest pain; Cough; COVID-19; Dysuria; Exposure to COVID-19 virus; Fever due to unspecified condition; Inguinal hernia, bilateral; Need for prophylactic vaccination and inoculation against single bacterial disease; Other malaise and fatigue; Other specified sites of sprains and strains; Painful respiration; Pityriasis versicolor; Encounter for general adult medical examination without abnormal findings; Shortness of breath; Sprain of ankle; Tattoo of skin; Viral warts; Anxiety state; Vomiting alone; Chronic pain syndrome; Pharmacologic therapy; Disorder of skeletal system; Problems influencing health status; Ilioinguinal neuralgia (Bilateral); and Chronic groin pain (1ry area of Pain) (Bilateral) on their problem list. Today he comes in for evaluation of his Groin  Pain (both)  Pain Assessment: Location: Left, Right Groin Radiating: pain radiaties around to his back Onset: More than a month ago Duration: Chronic pain Quality: Pressure, Sharp, Aching Severity: 5 /10 (subjective, self-reported pain score)  Effect on ADL: limits my daily activities Timing: Constant Modifying factors: nothing BP: (!) 146/80  HR: 91  Onset and Duration: Gradual and Date of onset: 7 years ago Cause of pain:  groin hernia  repair Severity: Getting worse, NAS-11 at its worse: 10/10, NAS-11 at its best: 3/10, NAS-11 now: 6/10, and NAS-11 on the average: 6/10 Timing: During activity or exercise and After activity or exercise Aggravating Factors: Bending, Intercourse (sex), Kneeling, Lifiting, Motion, Prolonged sitting, Prolonged standing, Surgery made it worse, Twisting, Walking, Walking uphill, and Walking downhill Alleviating Factors:  nothing Associated Problems: Pain that wakes patient up Quality of Pain: Aching, Agonizing, Pressure-like, Sharp, Shooting, Stabbing, and Tender Previous Examinations or Tests: CT scan, MRI scan, and X-rays Previous Treatments: Steroid treatments by mouth  According to the patient the primary area of pain is that of his groin (Bilateral) (R=L).  He indicates the pain to have started approximately 8 years ago due to bilateral inguinal hernias.  He describes the hernias to have developed secondary to poor exercise technique.  A year after he developed a hernia he had a bilateral hernia repair using mesh which apparently did help the hernia but did not help the pain.  He has continued to have pain since.  He has had multiple evaluations, but he describes that nothing seems to have helped the pain.  Occasionally this pain seems to be accompanied by some low back pain, which is intermittent.  At one time he refers having had some oral steroid taper prescribed to him which did help with some of the pain at the time, but nothing  else seem to have been  of any significant benefit.  He does describe having had an attempted local anesthetic and steroid injection into the inguinal area, apparently done by his surgeon, but he refers that it did not provide him with any short-term or long-term relief of the pain.  Today he comes in to see what is it that we can offer him.  Patient denies any nerve conduction testing.  Physical exam was noncontributory.  He is able to toe walk and heel walk without any problems.  Hyperextension and rotation of the lumbar spine were completely negative.  Patrick maneuver was also negative.  Hip range of motion was within normal limits and also negative for reproduction of patient's pain.  Straight leg raise was also negative.  Pharmacotherapy: The patient denies currently taking any medications for this pain although he refers having tried multiple different medications.  Initial impression: Possible bilateral ilioinguinal neuralgia likely to be secondary to entrapment within scar tissue.  Today I took the time to go over some alternatives with the patient, which I have listed at the end of this note under "interventional therapies under consideration".  I have also answered all of the patient's questions regarding some of these therapies.  The patient indicated that he needs to think about these alternatives and that he will contact office if and when he decides to proceed with any of the therapies offered to him.  [name] was informed that I continue to offer evaluations and recommendations for medication management but I no longer take patients to write for their medications. I informed him that this visit is an evaluation only and that on the follow up appointment I will go over the my review of the case, the results of available tests, and assuming that there are no contraindications, we will provide him with information about possible interventional pain management options. At that time he will have the opportunity to  decide whether or not to proceed with those therapies. In the event that Mr. Dreibelbis decides not to go with those options, or prefers to stay away from interventional therapies, this will conclude our involvement in the case.   Historic Controlled Substance Pharmacotherapy Review  PMP and historical list of controlled substances: Lorazepam 1 mg tablet, 1 tab p.o. twice daily Current opioid analgesics:   None MME/day: 0 mg/day  Historical Monitoring: The patient  reports that he does not currently use drugs. List of prior UDS Testing: No results found for: "MDMA", "COCAINSCRNUR", "PCPSCRNUR", "PCPQUANT", "CANNABQUANT", "THCU", "ETH", "CBDTHCR", "D8THCCBX", "D9THCCBX" Historical Background Evaluation: [place] PMP: PDMP reviewed during this encounter. Review of the past 52-month conducted.             PMP NARX Score Report:  Narcotic: 110 Sedative: 271 Stimulant: 000 [place] Department of public safety, offender search: (Editor, commissioningInformation) Non-contributory Risk Assessment Profile: Aberrant behavior: None observed or detected today Risk factors for fatal opioid overdose: None identified today PMP NARX Overdose Risk Score: 010 Fatal overdose hazard ratio (HR): Calculation deferred Non-fatal overdose hazard ratio (HR): Calculation deferred Risk of opioid abuse or dependence: 0.7-3.0% with doses ? 36 MME/day and 6.1-26% with doses ? 120 MME/day. Substance use disorder (SUD) risk level: See below Personal History of Substance Abuse (SUD-Substance use disorder):  Alcohol: Negative  Illegal Drugs: Negative  Rx Drugs: Negative  ORT Risk Level calculation: Low Risk  Opioid Risk Tool - 02/06/22 1029       Family History of Substance Abuse   Alcohol Negative  Illegal Drugs Negative    Rx Drugs Negative      Personal History of Substance Abuse   Alcohol Negative    Illegal Drugs Negative    Rx Drugs Negative      Age   Age between 73-45 years  Yes      History of Preadolescent Sexual Abuse    History of Preadolescent Sexual Abuse Negative or Male      Psychological Disease   Psychological Disease Negative    Depression Negative      Total Score   Opioid Risk Tool Scoring 1    Opioid Risk Interpretation Low Risk            ORT Scoring interpretation table:  Score <3 = Low Risk for SUD  Score between 4-7 = Moderate Risk for SUD  Score >8 = High Risk for Opioid Abuse   PHQ-2 Depression Scale:  Total score:    PHQ-2 Scoring interpretation table: (Score and probability of major depressive disorder)  Score 0 = No depression  Score 1 = 15.4% Probability  Score 2 = 21.1% Probability  Score 3 = 38.4% Probability  Score 4 = 45.5% Probability  Score 5 = 56.4% Probability  Score 6 = 78.6% Probability   PHQ-9 Depression Scale:  Total score:    PHQ-9 Scoring interpretation table:  Score 0-4 = No depression  Score 5-9 = Mild depression  Score 10-14 = Moderate depression  Score 15-19 = Moderately severe depression  Score 20-27 = Severe depression (2.4 times higher risk of SUD and 2.89 times higher risk of overuse)   Pharmacologic Plan: As per protocol, I have not taken over any controlled substance management, pending the results of ordered tests and/or consults.            Initial impression: Pending review of available data and ordered tests.  Meds   Current Outpatient Medications:    LORazepam (ATIVAN) 1 MG tablet, Take 1 mg by mouth as needed., Disp: , Rfl:    metoprolol succinate (TOPROL-XL) 25 MG 24 hr tablet, TAKE ONE TABLET BY MOUTH ONCE DAILY, Disp: 90 tablet, Rfl: 1  Imaging Review   Complexity Note: Imaging results reviewed.                         ROS  Cardiovascular: Heart trouble and Chest pain Pulmonary or Respiratory: No reported pulmonary signs or symptoms such as wheezing and difficulty taking a deep full breath (Asthma), difficulty blowing air out (Emphysema), coughing up mucus (Bronchitis), persistent dry cough, or temporary stoppage of  breathing during sleep Neurological: No reported neurological signs or symptoms such as seizures, abnormal skin sensations, urinary and/or fecal incontinence, being born with an abnormal open spine and/or a tethered spinal cord Psychological-Psychiatric: No reported psychological or psychiatric signs or symptoms such as difficulty sleeping, anxiety, depression, delusions or hallucinations (schizophrenial), mood swings (bipolar disorders) or suicidal ideations or attempts Gastrointestinal: No reported gastrointestinal signs or symptoms such as vomiting or evacuating blood, reflux, heartburn, alternating episodes of diarrhea and constipation, inflamed or scarred liver, or pancreas or irrregular and/or infrequent bowel movements Genitourinary: No reported renal or genitourinary signs or symptoms such as difficulty voiding or producing urine, peeing blood, non-functioning kidney, kidney stones, difficulty emptying the bladder, difficulty controlling the flow of urine, or chronic kidney disease Hematological: No reported hematological signs or symptoms such as prolonged bleeding, low or poor functioning platelets, bruising or bleeding easily, hereditary bleeding problems, low energy levels  due to low hemoglobin or being anemic Endocrine: No reported endocrine signs or symptoms such as high or low blood sugar, rapid heart rate due to high thyroid levels, obesity or weight gain due to slow thyroid or thyroid disease Rheumatologic: No reported rheumatological signs and symptoms such as fatigue, joint pain, tenderness, swelling, redness, heat, stiffness, decreased range of motion, with or without associated rash Musculoskeletal: Negative for myasthenia gravis, muscular dystrophy, multiple sclerosis or malignant hyperthermia Work History: Working full time  Allergies  Mr. Kaczmarek has No Known Allergies.  Laboratory Chemistry Profile   Renal No results found for: "BUN", "CREATININE", "LABCREA", "BCR", "GFR",  "GFRAA", "GFRNONAA", "SPECGRAV", "PHUR", "PROTEINUR"   Electrolytes No results found for: "NA", "K", "CL", "CALCIUM", "MG", "PHOS"   Hepatic Lab Results  Component Value Date   AST 11 05/21/2019   ALT 13 05/21/2019     ID No results found for: "LYMEIGGIGMAB", "HIV", "SARSCOV2NAA", "STAPHAUREUS", "MRSAPCR", "HCVAB", "PREGTESTUR", "RMSFIGG", "QFVRPH1IGG", "QFVRPH2IGG"   Bone No results found for: "VD25OH", "VD125OH2TOT", "GQ6761PJ0", "DT2671IW5", "25OHVITD1", "25OHVITD2", "25OHVITD3", "TESTOFREE", "TESTOSTERONE"   Endocrine No results found for: "GLUCOSE", "GLUCOSEU", "HGBA1C", "TSH", "FREET4", "TESTOFREE", "TESTOSTERONE", "SHBG", "ESTRADIOL", "ESTRADIOLPCT", "ESTRADIOLFRE", "LABPREG", "ACTH", "CRTSLPL", "UCORFRPERLTR", "UCORFRPERDAY", "CORTISOLBASE"   Neuropathy No results found for: "VITAMINB12", "FOLATE", "HGBA1C", "HIV"   CNS No results found for: "COLORCSF", "APPEARCSF", "RBCCOUNTCSF", "WBCCSF", "POLYSCSF", "LYMPHSCSF", "EOSCSF", "PROTEINCSF", "GLUCCSF", "JCVIRUS", "CSFOLI", "IGGCSF", "LABACHR", "ACETBL"   Inflammation (CRP: Acute  ESR: Chronic) No results found for: "CRP", "ESRSEDRATE", "LATICACIDVEN"   Rheumatology No results found for: "RF", "ANA", "LABURIC", "URICUR", "LYMEIGGIGMAB", "LYMEABIGMQN", "HLAB27"   Coagulation Lab Results  Component Value Date   PLT 231 05/21/2019     Cardiovascular Lab Results  Component Value Date   HGB 15.8 05/21/2019   HCT 45.4 05/21/2019     Screening No results found for: "SARSCOV2NAA", "COVIDSOURCE", "STAPHAUREUS", "MRSAPCR", "HCVAB", "HIV", "PREGTESTUR"   Cancer No results found for: "CEA", "CA125", "LABCA2"   Allergens No results found for: "ALMOND", "APPLE", "ASPARAGUS", "AVOCADO", "BANANA", "BARLEY", "BASIL", "BAYLEAF", "GREENBEAN", "LIMABEAN", "WHITEBEAN", "BEEFIGE", "REDBEET", "BLUEBERRY", "BROCCOLI", "CABBAGE", "MELON", "CARROT", "CASEIN", "CASHEWNUT", "CAULIFLOWER", "CELERY"     Note: Lab results reviewed.  PFSH   Drug: Mr. Deyoung  reports that he does not currently use drugs. Alcohol:  reports current alcohol use. Tobacco:  reports that he has quit smoking. He has never used smokeless tobacco. Medical:  has a past medical history of Palpitations, Premature atrial contractions, and PVC's (premature ventricular contractions). Family: family history includes Hypertension in his father.  Past Surgical History:  Procedure Laterality Date   HERNIA REPAIR  01/2016   Active Ambulatory Problems    Diagnosis Date Noted   Nausea without vomiting 12/09/2018   Abdominal pain, epigastric 05/21/2019   Diarrhea 05/21/2019   Hematemesis 05/21/2019   Intermittent palpitations 08/07/2020   Anxiety 09/07/2021   Abdominal pain, generalized 04/14/2019   Acute bronchitis 03/07/2015   Acute sinusitis 07/29/2017   Acute tonsillitis 05/18/2014   Body mass index (BMI) 24.0-24.9, adult 07/15/2020   Body mass index between 19-24, adult 12/09/2018   Body mass index, pediatric, 5th percentile to less than 85th percentile for age 37/05/2016   Cellulitis of left foot 07/15/2020   Chest pain 10/21/2016   Cough 08/01/2018   COVID-19 03/01/2020   Dysuria 04/30/2016   Exposure to COVID-19 virus 05/18/2020   Fever due to unspecified condition 07/29/2017   Inguinal hernia, bilateral 02/01/2016   Need for prophylactic vaccination and inoculation against single bacterial disease 12/14/2016   Other malaise and fatigue 12/09/2018  Other specified sites of sprains and strains 04/20/2015   Painful respiration 03/07/2015   Pityriasis versicolor 12/31/2011   Encounter for general adult medical examination without abnormal findings 07/30/2015   Shortness of breath 08/01/2018   Sprain of ankle 12/31/2011   Tattoo of skin 07/15/2020   Viral warts 12/31/2011   Anxiety state 08/04/2018   Vomiting alone 05/03/2019   Chronic pain syndrome 02/06/2022   Pharmacologic therapy 02/06/2022   Disorder of skeletal system 02/06/2022    Problems influencing health status 02/06/2022   Ilioinguinal neuralgia (Bilateral) 02/06/2022   Chronic groin pain (1ry area of Pain) (Bilateral) 02/06/2022   Resolved Ambulatory Problems    Diagnosis Date Noted   No Resolved Ambulatory Problems   Past Medical History:  Diagnosis Date   Palpitations    Premature atrial contractions    PVC's (premature ventricular contractions)    Constitutional Exam  General appearance: Well nourished, well developed, and well hydrated. In no apparent acute distress Vitals:   02/06/22 1018  BP: (!) 146/80  Pulse: 91  Temp: (!) 97.4 F (36.3 C)  SpO2: 99%  Weight: 164 lb (74.4 kg)  Height: '5\' 8"'  (1.727 m)   BMI Assessment: Estimated body mass index is 24.94 kg/m as calculated from the following:   Height as of this encounter: '5\' 8"'  (1.727 m).   Weight as of this encounter: 164 lb (74.4 kg).  BMI interpretation table: BMI level Category Range association with higher incidence of chronic pain  <18 kg/m2 Underweight   18.5-24.9 kg/m2 Ideal body weight   25-29.9 kg/m2 Overweight Increased incidence by 20%  30-34.9 kg/m2 Obese (Class I) Increased incidence by 68%  35-39.9 kg/m2 Severe obesity (Class II) Increased incidence by 136%  >40 kg/m2 Extreme obesity (Class III) Increased incidence by 254%   Patient's current BMI Ideal Body weight  Body mass index is 24.94 kg/m. Ideal body weight: 68.4 kg (150 lb 12.7 oz) Adjusted ideal body weight: 70.8 kg (156 lb 1.2 oz)   BMI Readings from Last 4 Encounters:  02/06/22 24.94 kg/m  09/07/21 25.00 kg/m  12/22/20 24.31 kg/m  11/10/20 24.48 kg/m   Wt Readings from Last 4 Encounters:  02/06/22 164 lb (74.4 kg)  09/07/21 164 lb 6.4 oz (74.6 kg)  12/22/20 164 lb 9.6 oz (74.7 kg)  11/10/20 165 lb 12.8 oz (75.2 kg)    Psych/Mental status: Alert, oriented x 3 (person, place, & time)       Eyes: PERLA Respiratory: No evidence of acute respiratory distress  Assessment  Primary Diagnosis &  Pertinent Problem List: The primary encounter diagnosis was Chronic pain syndrome. Diagnoses of Chronic groin pain (Bilateral), Ilioinguinal neuralgia (Bilateral), Pharmacologic therapy, Disorder of skeletal system, and Problems influencing health status were also pertinent to this visit.  Visit Diagnosis (New problems to examiner): 1. Chronic pain syndrome   2. Chronic groin pain (Bilateral)   3. Ilioinguinal neuralgia (Bilateral)   4. Pharmacologic therapy   5. Disorder of skeletal system   6. Problems influencing health status    Plan of Care (Initial workup plan)  Note: Mr. Duvall was reminded that as per protocol, today's visit has been an evaluation only. We have not taken over the patient's controlled substance management.  Problem-specific plan: No problem-specific Assessment & Plan notes found for this encounter.  Lab Orders  No laboratory test(s) ordered today   Imaging Orders  No imaging studies ordered today   Referral Orders  No referral(s) requested today   Procedure Orders  No procedure(s) ordered today   Pharmacotherapy (current): Medications ordered:  No orders of the defined types were placed in this encounter.  Medications administered during this visit: Arlynn T. Abruzzese had no medications administered during this visit.   Analgesic Pharmacological management:  Opioid Analgesics: We no longer take patients for opioid medication management.   Membrane stabilizer: To be determined at a later time  Muscle relaxant: To be determined at a later time  NSAID: To be determined at a later time  Other analgesic(s): To be determined at a later time   Interventional management options: Mr. Volkov was informed that there is no guarantee that he would be a candidate for interventional therapies. The decision will be based on the results of diagnostic studies, as well as Mr. Zeitlin risk profile.  Procedure(s) under consideration:  Pending results of ordered  studies      Interventional Therapies  Risk  Complexity Considerations:   Estimated body mass index is 24.94 kg/m as calculated from the following:   Height as of this encounter: '5\' 8"'  (1.727 m).   Weight as of this encounter: 164 lb (74.4 kg). WNL   Planned  Pending:   See above for possible orders   Under consideration:   Diagnostic bilateral ilioinguinal nerve block #1  Possible bilateral ilioinguinal nerve RFA  Diagnostic bilateral T12 and/for L1 dorsal root ganglia block #1  Possible bilateral T12, L1 DRG RFA  Possible bilateral ilioinguinal peripheral nerve stimulator (PNS) trial and implant    Completed:   None at this time   Completed by other providers:   None at this time   Therapeutic  Palliative (PRN) options:   None established      Provider-requested follow-up: Return if symptoms worsen or fail to improve.  No future appointments.   Note by: Gaspar Cola, MD Date: 02/06/2022; Time: 7:38 AM

## 2022-02-04 DIAGNOSIS — R69 Illness, unspecified: Secondary | ICD-10-CM | POA: Diagnosis not present

## 2022-02-06 ENCOUNTER — Ambulatory Visit: Payer: 59 | Attending: Pain Medicine | Admitting: Pain Medicine

## 2022-02-06 ENCOUNTER — Encounter: Payer: Self-pay | Admitting: Pain Medicine

## 2022-02-06 VITALS — BP 146/80 | HR 91 | Temp 97.4°F | Ht 68.0 in | Wt 164.0 lb

## 2022-02-06 DIAGNOSIS — G8929 Other chronic pain: Secondary | ICD-10-CM | POA: Diagnosis not present

## 2022-02-06 DIAGNOSIS — Z789 Other specified health status: Secondary | ICD-10-CM | POA: Diagnosis not present

## 2022-02-06 DIAGNOSIS — R1030 Lower abdominal pain, unspecified: Secondary | ICD-10-CM | POA: Diagnosis not present

## 2022-02-06 DIAGNOSIS — Z79899 Other long term (current) drug therapy: Secondary | ICD-10-CM | POA: Diagnosis not present

## 2022-02-06 DIAGNOSIS — G894 Chronic pain syndrome: Secondary | ICD-10-CM | POA: Insufficient documentation

## 2022-02-06 DIAGNOSIS — G5793 Unspecified mononeuropathy of bilateral lower limbs: Secondary | ICD-10-CM | POA: Insufficient documentation

## 2022-02-06 DIAGNOSIS — M899 Disorder of bone, unspecified: Secondary | ICD-10-CM | POA: Diagnosis not present

## 2022-02-06 NOTE — Patient Instructions (Signed)
____________________________________________________________________________________________  General Risks and Possible Complications  Patient Responsibilities: It is important that you read this as it is part of your informed consent. It is our duty to inform you of the risks and possible complications associated with treatments offered to you. It is your responsibility as a patient to read this and to ask questions about anything that is not clear or that you believe was not covered in this document.  Patient's Rights: You have the right to refuse treatment. You also have the right to change your mind, even after initially having agreed to have the treatment done. However, under this last option, if you wait until the last second to change your mind, you may be charged for the materials used up to that point.  Introduction: Medicine is not an exact science. Everything in Medicine, including the lack of treatment(s), carries the potential for danger, harm, or loss (which is by definition: Risk). In Medicine, a complication is a secondary problem, condition, or disease that can aggravate an already existing one. All treatments carry the risk of possible complications. The fact that a side effects or complications occurs, does not imply that the treatment was conducted incorrectly. It must be clearly understood that these can happen even when everything is done following the highest safety standards.  No treatment: You can choose not to proceed with the proposed treatment alternative. The "PRO(s)" would include: avoiding the risk of complications associated with the therapy. The "CON(s)" would include: not getting any of the treatment benefits. These benefits fall under one of three categories: diagnostic; therapeutic; and/or palliative. Diagnostic benefits include: getting information which can ultimately lead to improvement of the disease or symptom(s). Therapeutic benefits are those associated with  the successful treatment of the disease. Finally, palliative benefits are those related to the decrease of the primary symptoms, without necessarily curing the condition (example: decreasing the pain from a flare-up of a chronic condition, such as incurable terminal cancer).  General Risks and Complications: These are associated to most interventional treatments. They can occur alone, or in combination. They fall under one of the following six (6) categories: no benefit or worsening of symptoms; bleeding; infection; nerve damage; allergic reactions; and/or death. No benefits or worsening of symptoms: In Medicine there are no guarantees, only probabilities. No healthcare provider can ever guarantee that a medical treatment will work, they can only state the probability that it may. Furthermore, there is always the possibility that the condition may worsen, either directly, or indirectly, as a consequence of the treatment. Bleeding: This is more common if the patient is taking a blood thinner, either prescription or over the counter (example: Goody Powders, Fish oil, Aspirin, Garlic, etc.), or if suffering a condition associated with impaired coagulation (example: Hemophilia, cirrhosis of the liver, low platelet counts, etc.). However, even if you do not have one on these, it can still happen. If you have any of these conditions, or take one of these drugs, make sure to notify your treating physician. Infection: This is more common in patients with a compromised immune system, either due to disease (example: diabetes, cancer, human immunodeficiency virus [HIV], etc.), or due to medications or treatments (example: therapies used to treat cancer and rheumatological diseases). However, even if you do not have one on these, it can still happen. If you have any of these conditions, or take one of these drugs, make sure to notify your treating physician. Nerve Damage: This is more common when the treatment is an    invasive one, but it can also happen with the use of medications, such as those used in the treatment of cancer. The damage can occur to small secondary nerves, or to large primary ones, such as those in the spinal cord and brain. This damage may be temporary or permanent and it may lead to impairments that can range from temporary numbness to permanent paralysis and/or brain death. Allergic Reactions: Any time a substance or material comes in contact with our body, there is the possibility of an allergic reaction. These can range from a mild skin rash (contact dermatitis) to a severe systemic reaction (anaphylactic reaction), which can result in death. Death: In general, any medical intervention can result in death, most of the time due to an unforeseen complication. ____________________________________________________________________________________________    

## 2022-02-06 NOTE — Progress Notes (Signed)
Safety precautions to be maintained throughout the outpatient stay will include: orient to surroundings, keep bed in low position, maintain call bell within reach at all times, provide assistance with transfer out of bed and ambulation.  

## 2022-02-12 DIAGNOSIS — R69 Illness, unspecified: Secondary | ICD-10-CM | POA: Diagnosis not present

## 2022-02-18 DIAGNOSIS — R69 Illness, unspecified: Secondary | ICD-10-CM | POA: Diagnosis not present

## 2022-02-25 DIAGNOSIS — R69 Illness, unspecified: Secondary | ICD-10-CM | POA: Diagnosis not present

## 2022-03-07 ENCOUNTER — Ambulatory Visit
Admission: EM | Admit: 2022-03-07 | Discharge: 2022-03-07 | Disposition: A | Discharge status: Home / Self Care | Payer: 59 | Attending: Family Medicine | Admitting: Family Medicine

## 2022-03-07 DIAGNOSIS — Z23 Encounter for immunization: Secondary | ICD-10-CM

## 2022-03-07 DIAGNOSIS — S40811A Abrasion of right upper arm, initial encounter: Secondary | ICD-10-CM | POA: Diagnosis not present

## 2022-03-07 DIAGNOSIS — L089 Local infection of the skin and subcutaneous tissue, unspecified: Secondary | ICD-10-CM

## 2022-03-07 MED ORDER — CEPHALEXIN 500 MG PO CAPS: 500.0000 mg | ORAL_CAPSULE | Freq: Two times a day (BID) | ORAL | 0 refills | Status: DC

## 2022-03-07 MED ORDER — TETANUS-DIPHTH-ACELL PERTUSSIS 5-2.5-18.5 LF-MCG/0.5 IM SUSY
0.5000 mL | PREFILLED_SYRINGE | Freq: Once | INTRAMUSCULAR | Status: AC
  Administered 2022-03-07: 0.5 mL via INTRAMUSCULAR

## 2022-03-07 MED ORDER — CHLORHEXIDINE GLUCONATE 4 % EX LIQD: Freq: Every day | CUTANEOUS | 0 refills | Status: DC | PRN

## 2022-03-07 MED ORDER — MUPIROCIN 2 % EX OINT: 1.0000 | TOPICAL_OINTMENT | Freq: Two times a day (BID) | CUTANEOUS | 0 refills | Status: DC

## 2022-03-07 NOTE — Discharge Instructions (Signed)
Clean the area with Hibiclens at least once daily and apply mupirocin ointment and a nonstick dressing.  Keep it covered at all times until fully healed.  Take the full course of antibiotics until completed.

## 2022-03-07 NOTE — ED Provider Notes (Signed)
[facility]    CSN: [id number] Arrival date & time: 03/07/22  1346      History   Chief Complaint Chief Complaint  Patient presents with   Wound Check    HPI Lawrence Whitehead is a 26 y.o. male.   Presenting today for some possibly infected wounds on his right forearm.  States he used an Proofreader on his forearm on 02/22/2022 and the wounds did not heal.  They have been draining pus and scabbing over intermittently.  Denies fever, chills, diaphoresis, nausea, vomiting.  Has been cleaning the area with alcohol and occasionally putting ointment and bandages on them.  Unclear when his last tetanus shot was but he thinks it was more than 5 years ago.    Past Medical History:  Diagnosis Date   Palpitations    Premature atrial contractions    PVC's (premature ventricular contractions)     Patient Active Problem List   Diagnosis Date Noted   Chronic pain syndrome 02/06/2022   Pharmacologic therapy 02/06/2022   Disorder of skeletal system 02/06/2022   Problems influencing health status 02/06/2022   Ilioinguinal neuralgia (Bilateral) 02/06/2022   Chronic groin pain (1ry area of Pain) (Bilateral) 02/06/2022   Anxiety 09/07/2021   Intermittent palpitations 08/07/2020   Body mass index (BMI) 24.0-24.9, adult 07/15/2020   Cellulitis of left foot 07/15/2020   Tattoo of skin 07/15/2020   Exposure to COVID-19 virus 05/18/2020   COVID-19 03/01/2020   Abdominal pain, epigastric 05/21/2019   Diarrhea 05/21/2019   Hematemesis 05/21/2019   Vomiting alone 05/03/2019   Abdominal pain, generalized 04/14/2019   Nausea without vomiting 12/09/2018   Body mass index between 19-24, adult 12/09/2018   Other malaise and fatigue 12/09/2018   Anxiety state 08/04/2018   Cough 08/01/2018   Shortness of breath 08/01/2018   Acute sinusitis 07/29/2017   Fever due to unspecified condition 07/29/2017   Need for prophylactic vaccination and inoculation against single bacterial  disease 12/14/2016   Chest pain 10/21/2016   Body mass index, pediatric, 5th percentile to less than 85th percentile for age 49/05/2016   Dysuria 04/30/2016   Inguinal hernia, bilateral 02/01/2016   Encounter for general adult medical examination without abnormal findings 07/30/2015   Other specified sites of sprains and strains 04/20/2015   Acute bronchitis 03/07/2015   Painful respiration 03/07/2015   Acute tonsillitis 05/18/2014   Pityriasis versicolor 12/31/2011   Sprain of ankle 12/31/2011   Viral warts 12/31/2011    Past Surgical History:  Procedure Laterality Date   HERNIA REPAIR  01/2016       Home Medications    Prior to Admission medications   Medication Sig Start Date End Date Taking? Authorizing Provider  cephALEXin (KEFLEX) 500 MG capsule Take 1 capsule (500 mg total) by mouth 2 (two) times daily. 03/07/22  Yes Particia Nearing, PA-C  chlorhexidine (HIBICLENS) 4 % external liquid Apply topically daily as needed. 03/07/22  Yes Particia Nearing, PA-C  mupirocin ointment (BACTROBAN) 2 % Apply 1 Application topically 2 (two) times daily. 03/07/22  Yes Particia Nearing, PA-C  LORazepam (ATIVAN) 1 MG tablet Take 1 mg by mouth as needed.    [provider]  metoprolol succinate (TOPROL-XL) 25 MG 24 hr tablet TAKE ONE TABLET BY MOUTH ONCE DAILY 06/15/21   Laurann [place], PA-C    Family History Family History  Problem Relation Age of Onset   Hypertension Father    Colon cancer Neg Hx  Celiac disease Neg Hx    Inflammatory bowel disease Neg Hx     Social History Social History   Tobacco Use   Smoking status: Some Days    Types: Cigarettes   Smokeless tobacco: Never  Vaping Use   Vaping Use: Former  Substance Use Topics   Alcohol use: Yes    Comment: no heavy use   Drug use: Never     Allergies   Patient has no known allergies.   Review of Systems Review of Systems PER HPI  Physical Exam Triage Vital Signs ED Triage Vitals   Enc Vitals Group     BP 03/07/22 1412 130/80     Pulse Rate 03/07/22 1412 100     Resp 03/07/22 1412 16     Temp 03/07/22 1412 98.7 F (37.1 C)     Temp Source 03/07/22 1412 Oral     SpO2 03/07/22 1412 98 %     Weight --      Height --      Head Circumference --      Peak Flow --      Pain Score 03/07/22 1411 0     Pain Loc --      Pain Edu? --      Excl. in GC? --    No data found.  Updated Vital Signs BP 130/80 (BP Location: Right Arm)   Pulse 100   Temp 98.7 F (37.1 C) (Oral)   Resp 16   SpO2 98%   Visual Acuity Right Eye Distance:   Left Eye Distance:   Bilateral Distance:    Right Eye Near:   Left Eye Near:    Bilateral Near:     Physical Exam Vitals and nursing note reviewed.  Constitutional:      Appearance: Normal appearance.  HENT:     Head: Atraumatic.  Eyes:     Extraocular Movements: Extraocular movements intact.     Conjunctiva/sclera: Conjunctivae normal.  Cardiovascular:     Rate and Rhythm: Normal rate and regular rhythm.  Pulmonary:     Effort: Pulmonary effort is normal.     Breath sounds: Normal breath sounds.  Musculoskeletal:        General: Normal range of motion.     Cervical back: Normal range of motion and neck supple.  Skin:    General: Skin is warm.     Comments: 2 linear abrasions to the right forearm, slightly gaping, mild erythema surrounding them but no active drainage or bleeding  Neurological:     General: No focal deficit present.     Mental Status: He is oriented to person, place, and time.     Comments: Right arm neurovascularly intact  Psychiatric:        Mood and Affect: Mood normal.        Thought Content: Thought content normal.        Judgment: Judgment normal.      UC Treatments / Results  Labs (all labs ordered are listed, but only abnormal results are displayed) Labs Reviewed - No data to display  EKG   Radiology No results found.  Procedures Procedures (including critical care  time)  Medications Ordered in UC Medications  Tdap (BOOSTRIX) injection 0.5 mL (0.5 mLs Intramuscular Given 03/07/22 1444)    Initial Impression / Assessment and Plan / UC Course  I have reviewed the triage vital signs and the nursing notes.  Pertinent labs & imaging results that were available during my care  of the patient were reviewed by me and considered in my medical decision making (see chart for details).     Given poor healing and intermittent drainage of pus, will cover with Keflex and provide Hibiclens and Bactroban for home wound care.  Discussed keeping covered with a nonstick dressing at all times until fully healed.  Tetanus updated.  Return for worsening symptoms.  Final Clinical Impressions(s) / UC Diagnoses   Final diagnoses:  Abrasion of right upper extremity, initial encounter  Infected abrasion     Discharge Instructions      Clean the area with Hibiclens at least once daily and apply mupirocin ointment and a nonstick dressing.  Keep it covered at all times until fully healed.  Take the full course of antibiotics until completed.    ED Prescriptions     Medication Sig Dispense Auth. Provider   cephALEXin (KEFLEX) 500 MG capsule Take 1 capsule (500 mg total) by mouth 2 (two) times daily. 14 capsule Particia Nearing, [place]   chlorhexidine (HIBICLENS) 4 % external liquid Apply topically daily as needed. 120 mL Particia Nearing, PA-C   mupirocin ointment (BACTROBAN) 2 % Apply 1 Application topically 2 (two) times daily. 22 g Particia Nearing, [place]      PDMP not reviewed this encounter.   Particia Nearing, [place] 03/07/22 1503

## 2022-03-07 NOTE — ED Triage Notes (Signed)
Pt presents for wound check in right arm. Reports she used a Armed forces logistics/support/administrative officer laser on 02/22/22 and wound is not healed properly. Pt clean wound with alcohol.

## 2022-03-11 DIAGNOSIS — R69 Illness, unspecified: Secondary | ICD-10-CM | POA: Diagnosis not present

## 2022-03-18 DIAGNOSIS — R69 Illness, unspecified: Secondary | ICD-10-CM | POA: Diagnosis not present

## 2022-04-01 DIAGNOSIS — R69 Illness, unspecified: Secondary | ICD-10-CM | POA: Diagnosis not present

## 2022-04-08 DIAGNOSIS — R69 Illness, unspecified: Secondary | ICD-10-CM | POA: Diagnosis not present

## 2022-04-15 DIAGNOSIS — R69 Illness, unspecified: Secondary | ICD-10-CM | POA: Diagnosis not present

## 2022-04-25 DIAGNOSIS — Z6825 Body mass index (BMI) 25.0-25.9, adult: Secondary | ICD-10-CM | POA: Diagnosis not present

## 2022-04-25 DIAGNOSIS — R030 Elevated blood-pressure reading, without diagnosis of hypertension: Secondary | ICD-10-CM | POA: Diagnosis not present

## 2022-04-25 DIAGNOSIS — N4289 Other specified disorders of prostate: Secondary | ICD-10-CM | POA: Diagnosis not present

## 2022-04-25 DIAGNOSIS — R69 Illness, unspecified: Secondary | ICD-10-CM | POA: Diagnosis not present

## 2022-04-25 DIAGNOSIS — Z0000 Encounter for general adult medical examination without abnormal findings: Secondary | ICD-10-CM | POA: Diagnosis not present

## 2022-05-02 DIAGNOSIS — R69 Illness, unspecified: Secondary | ICD-10-CM | POA: Diagnosis not present

## 2022-05-13 DIAGNOSIS — R69 Illness, unspecified: Secondary | ICD-10-CM | POA: Diagnosis not present

## 2022-05-20 DIAGNOSIS — R69 Illness, unspecified: Secondary | ICD-10-CM | POA: Diagnosis not present

## 2022-05-30 ENCOUNTER — Ambulatory Visit: Payer: 59 | Admitting: Urology

## 2022-05-30 ENCOUNTER — Encounter: Payer: Self-pay | Admitting: Urology

## 2022-05-30 VITALS — BP 134/82 | HR 91

## 2022-05-30 DIAGNOSIS — R69 Illness, unspecified: Secondary | ICD-10-CM | POA: Diagnosis not present

## 2022-05-30 DIAGNOSIS — N4289 Other specified disorders of prostate: Secondary | ICD-10-CM

## 2022-05-30 DIAGNOSIS — Z87440 Personal history of urinary (tract) infections: Secondary | ICD-10-CM

## 2022-05-30 DIAGNOSIS — N420 Calculus of prostate: Secondary | ICD-10-CM

## 2022-05-30 LAB — URINALYSIS, ROUTINE W REFLEX MICROSCOPIC
Bilirubin, UA: NEGATIVE
Glucose, UA: NEGATIVE
Ketones, UA: NEGATIVE
Leukocytes,UA: NEGATIVE
Nitrite, UA: NEGATIVE
Protein,UA: NEGATIVE
RBC, UA: NEGATIVE
Specific Gravity, UA: 1.02 (ref 1.005–1.030)
Urobilinogen, Ur: 0.2 mg/dL (ref 0.2–1.0)
pH, UA: 7 (ref 5.0–7.5)

## 2022-05-30 NOTE — Progress Notes (Signed)
Subjective: 1. Calcification of prostate   2. Personal history of urinary infection      Consult requested by Clemmie Krill PA.  Lawrence Whitehead is a 27 yo male who had a recent CT for groin pain and was found to have prostatic stones. He has no prior history of kidney stones.  He has had prior UTI's starting at age 42 and has had about 1 per year since he was 38yr.  His symptoms are generally dysuria.  He has no dysuria now. He has no frequency, urgency or obstructive symptoms.  He had a cath UA at age 49 but no GU surgery.  He has had no hematuria.   He has some penile discomfort and some ED issues since he started lexapro for anxiety.   ROS:  Review of Systems  Constitutional:  Positive for malaise/fatigue.  Respiratory:  Positive for shortness of breath.   Cardiovascular:  Positive for chest pain.  Gastrointestinal:  Positive for nausea.  Musculoskeletal:  Positive for back pain and joint pain.  Neurological:  Positive for dizziness and headaches.  Psychiatric/Behavioral:  Positive for depression. The patient is nervous/anxious.   All other systems reviewed and are negative.   No Known Allergies  Past Medical History:  Diagnosis Date   Palpitations    Premature atrial contractions    PVC's (premature ventricular contractions)     Past Surgical History:  Procedure Laterality Date   HERNIA REPAIR  01/2016    Social History   Socioeconomic History   Marital status: Single    Spouse name: Not on file   Number of children: Not on file   Years of education: Not on file   Highest education level: Not on file  Occupational History   Occupation: tattoo artist  Tobacco Use   Smoking status: Some Days    Types: Cigarettes   Smokeless tobacco: Never  Vaping Use   Vaping Use: Former  Substance and Sexual Activity   Alcohol use: Yes    Comment: no heavy use   Drug use: Never   Sexual activity: Yes  Other Topics Concern   Not on file  Social History Narrative   Not on  file   Social Determinants of Health   Financial Resource Strain: Not on file  Food Insecurity: Not on file  Transportation Needs: Not on file  Physical Activity: Not on file  Stress: Not on file  Social Connections: Not on file  Intimate Partner Violence: Not on file    Family History  Problem Relation Age of Onset   Hypertension Father    Colon cancer Neg Hx    Celiac disease Neg Hx    Inflammatory bowel disease Neg Hx     Anti-infectives: Anti-infectives (From admission, onward)    None       Current Outpatient Medications  Medication Sig Dispense Refill   escitalopram (LEXAPRO) 5 MG tablet Take 5 mg by mouth daily.     LORazepam (ATIVAN) 1 MG tablet Take 1 mg by mouth as needed.     No current facility-administered medications for this visit.     Objective: Vital signs in last 24 hours: BP 134/82   Pulse 91   Intake/Output from previous day: No intake/output data recorded. Intake/Output this shift: @IOTHISSHIFT @   Physical Exam Vitals reviewed.  Constitutional:      Appearance: Normal appearance.  Abdominal:     General: Abdomen is flat.     Palpations: Abdomen is soft.     Hernia: No  hernia is present.  Genitourinary:    Comments: Normal phallus with an adequate meatus. Scrotum, testes and epididymis normal. AP/NST without mass. Prostate 1.5+ benign without nodules or tenderness. SV non-palpable.  Neurological:     Mental Status: He is alert.     Lab Results:  Results for orders placed or performed in visit on 05/30/22 (from the past 24 hour(s))  Urinalysis, Routine w reflex microscopic     Status: None   Collection Time: 05/30/22  1:20 PM  Result Value Ref Range   Specific Gravity, UA 1.020 1.005 - 1.030   pH, UA 7.0 5.0 - 7.5   Color, UA Yellow Yellow   Appearance Ur Clear Clear   Leukocytes,UA Negative Negative   Protein,UA Negative Negative/Trace   Glucose, UA Negative Negative   Ketones, UA Negative Negative   RBC, UA Negative  Negative   Bilirubin, UA Negative Negative   Urobilinogen, Ur 0.2 0.2 - 1.0 mg/dL   Nitrite, UA Negative Negative   Microscopic Examination Comment    Narrative   Performed at:  [facility] [address], [place], [place]  [id number] Lab Director: [place], Phone:  [phone]    BMET No results for input(s): "NA", "K", "CL", "CO2", "GLUCOSE", "BUN", "CREATININE", "CALCIUM" in the last 72 hours. PT/INR No results for input(s): "LABPROT", "INR" in the last 72 hours. ABG No results for input(s): "PHART", "HCO3" in the last 72 hours.  Invalid input(s): "PCO2", "PO2" UA is Clear.  Studies/Results: No results found. No results found. LINICAL DATA: Bilateral inguinal hernia  Prior hernia repair  Continued pain  EXAM: CT PELVIS WITH CONTRAST  TECHNIQUE: Multidetector CT imaging of the pelvis was performed using the standard protocol following the bolus administration of intravenous contrast.  RADIATION DOSE REDUCTION: This exam was performed according to the departmental dose-optimization program which includes automated exposure control, adjustment of the mA and/or kV according to patient size and/or use of iterative reconstruction technique.  CONTRAST: 141mL ISOVUE-300 IOPAMIDOL (ISOVUE-300) INJECTION 61%  COMPARISON: Ultrasound pelvis 09/12/2021  FINDINGS: Urinary Tract: No abnormality visualized.  Bowel: Unremarkable visualized pelvic bowel loops.  Vascular/Lymphatic: No pathologically enlarged lymph nodes. No significant vascular abnormality seen.  Reproductive: Extensive calcifications of the prostate.  Other: No inguinal hernias.  Musculoskeletal: No suspicious bone lesions identified.  IMPRESSION: 1. No recurrent inguinal hernias. 2. Extensive calcifications of the prostate.   Electronically Signed By: Miachel Roux M.D. On: 09/26/2021 08:16   Assessment/Plan: Prostate stones.  He has prostatic stones but no voiding symptoms  and a benign exam.  He needs no further evaluation or f/u for this.   Personal history of UTI's. UA is clear.   No orders of the defined types were placed in this encounter.    Orders Placed This Encounter  Procedures   Urinalysis, Routine w reflex microscopic     Return if symptoms worsen or fail to improve.    CC: Clemmie Krill PA      Lawrence Whitehead 05/31/2022

## 2022-05-31 ENCOUNTER — Encounter: Payer: Self-pay | Admitting: Urology

## 2022-06-03 DIAGNOSIS — R69 Illness, unspecified: Secondary | ICD-10-CM | POA: Diagnosis not present

## 2022-06-17 DIAGNOSIS — R69 Illness, unspecified: Secondary | ICD-10-CM | POA: Diagnosis not present

## 2022-09-18 DIAGNOSIS — Z6825 Body mass index (BMI) 25.0-25.9, adult: Secondary | ICD-10-CM | POA: Diagnosis not present

## 2022-09-18 DIAGNOSIS — F17210 Nicotine dependence, cigarettes, uncomplicated: Secondary | ICD-10-CM | POA: Diagnosis not present

## 2022-09-18 DIAGNOSIS — H539 Unspecified visual disturbance: Secondary | ICD-10-CM | POA: Diagnosis not present

## 2022-09-18 DIAGNOSIS — R030 Elevated blood-pressure reading, without diagnosis of hypertension: Secondary | ICD-10-CM | POA: Diagnosis not present

## 2022-10-17 DIAGNOSIS — L03116 Cellulitis of left lower limb: Secondary | ICD-10-CM | POA: Diagnosis not present

## 2023-01-27 DIAGNOSIS — R030 Elevated blood-pressure reading, without diagnosis of hypertension: Secondary | ICD-10-CM | POA: Diagnosis not present

## 2023-01-27 DIAGNOSIS — Z20828 Contact with and (suspected) exposure to other viral communicable diseases: Secondary | ICD-10-CM | POA: Diagnosis not present

## 2023-01-27 DIAGNOSIS — Z6825 Body mass index (BMI) 25.0-25.9, adult: Secondary | ICD-10-CM | POA: Diagnosis not present

## 2023-01-27 DIAGNOSIS — J039 Acute tonsillitis, unspecified: Secondary | ICD-10-CM | POA: Diagnosis not present

## 2023-02-01 ENCOUNTER — Emergency Department (HOSPITAL_COMMUNITY): Payer: 59

## 2023-02-01 ENCOUNTER — Emergency Department (HOSPITAL_COMMUNITY)
Admission: EM | Admit: 2023-02-01 | Discharge: 2023-02-01 | Disposition: A | Discharge status: Home / Self Care | Payer: 59 | Attending: Emergency Medicine | Admitting: Emergency Medicine

## 2023-02-01 ENCOUNTER — Other Ambulatory Visit: Payer: Self-pay

## 2023-02-01 ENCOUNTER — Encounter (HOSPITAL_COMMUNITY): Payer: Self-pay | Admitting: Emergency Medicine

## 2023-02-01 DIAGNOSIS — R0602 Shortness of breath: Secondary | ICD-10-CM | POA: Insufficient documentation

## 2023-02-01 DIAGNOSIS — R200 Anesthesia of skin: Secondary | ICD-10-CM | POA: Diagnosis not present

## 2023-02-01 DIAGNOSIS — R202 Paresthesia of skin: Secondary | ICD-10-CM | POA: Diagnosis not present

## 2023-02-01 DIAGNOSIS — R0789 Other chest pain: Secondary | ICD-10-CM | POA: Insufficient documentation

## 2023-02-01 DIAGNOSIS — R079 Chest pain, unspecified: Secondary | ICD-10-CM | POA: Diagnosis not present

## 2023-02-01 DIAGNOSIS — E876 Hypokalemia: Secondary | ICD-10-CM | POA: Diagnosis not present

## 2023-02-01 LAB — CBC
HCT: 45.8 % (ref 39.0–52.0)
Hemoglobin: 16 g/dL (ref 13.0–17.0)
MCH: 30.4 pg (ref 26.0–34.0)
MCHC: 34.9 g/dL (ref 30.0–36.0)
MCV: 87.1 fL (ref 80.0–100.0)
Platelets: 265 10*3/uL (ref 150–400)
RBC: 5.26 MIL/uL (ref 4.22–5.81)
RDW: 11.8 % (ref 11.5–15.5)
WBC: 7.8 10*3/uL (ref 4.0–10.5)
nRBC: 0 % (ref 0.0–0.2)

## 2023-02-01 LAB — BASIC METABOLIC PANEL
Anion gap: 11 (ref 5–15)
BUN: 12 mg/dL (ref 6–20)
CO2: 25 mmol/L (ref 22–32)
Calcium: 9.1 mg/dL (ref 8.9–10.3)
Chloride: 99 mmol/L (ref 98–111)
Creatinine, Ser: 0.83 mg/dL (ref 0.61–1.24)
GFR, Estimated: >60 mL/min (ref >60)
Glucose, Bld: 101 mg/dL — ABNORMAL HIGH (ref 70–99)
Potassium: 3.3 mmol/L — ABNORMAL LOW (ref 3.5–5.1)
Sodium: 135 mmol/L (ref 135–145)

## 2023-02-01 LAB — D-DIMER, QUANTITATIVE: D-Dimer, Quant: <0.27 ug{FEU}/mL (ref 0.00–0.50)

## 2023-02-01 LAB — TROPONIN I (HIGH SENSITIVITY): Troponin I (High Sensitivity): 2 ng/L (ref <18)

## 2023-02-01 MED ORDER — POTASSIUM CHLORIDE 20 MEQ PO PACK
40.0000 meq | PACK | Freq: Once | ORAL | Status: AC
  Administered 2023-02-01: 40 meq via ORAL
  Filled 2023-02-01: qty 2

## 2023-02-01 NOTE — ED Triage Notes (Addendum)
Pt presents with mid-sternal CP with with hand numbness for over 1 week, prescribed Ativan for anxiety, last taken today, also has some new stressors in life.  History of PVCs.

## 2023-02-01 NOTE — ED Provider Notes (Signed)
[place] EMERGENCY DEPARTMENT AT [facility] Provider Note   CSN: [id number] Arrival date & time: 02/01/23  1443     History  Chief Complaint  Patient presents with   Chest Pain    LORRY ANASTASI is a 27 y.o. male past medical history significant for chronic pain syndrome, palpitations, PVCs, anxiety who presents concern for midsternal chest pain, intermittent hand numbness for around 1 week.  He takes Ativan for anxiety, last taken today, reports overall decrease stress in life but has had similar chest tightness with anxiety in the past.  He reports some mild shortness of breath.  Denies any nausea, vomiting.  He reports the chest tightness is not worse with exertion.  He reports mild improvement with rest on the couch.   Chest Pain      Home Medications Prior to Admission medications   Medication Sig Start Date End Date Taking? Authorizing Provider  amoxicillin-clavulanate (AUGMENTIN) 875-125 MG tablet Take 1 tablet by mouth 2 (two) times daily. 01/27/23  Yes [provider]  ibuprofen (ADVIL) 200 MG tablet Take 200 mg by mouth every 6 (six) hours as needed for fever or headache.   Yes [provider]  Lactobacillus (PROBIOTIC ACIDOPHILUS PO) Take 1 capsule by mouth daily.   Yes [provider]  LORazepam (ATIVAN) 1 MG tablet Take 1 mg by mouth as needed for anxiety.   Yes [provider]  Multiple Vitamins-Minerals (MULTIVITAMIN MEN PO) Take 2 tablets by mouth daily.   Yes [provider]  OVER THE COUNTER MEDICATION Take 1 capsule by mouth daily. Blend of vit b12, ginseng, ginko biloba   Yes [provider]      Allergies    Patient has no known allergies.    Review of Systems   Review of Systems  Cardiovascular:  Positive for chest pain.  All other systems reviewed and are negative.   Physical Exam Updated Vital Signs BP (!) 149/82   Pulse (!) 116   Temp 98.7 F (37.1 C) (Oral)   Resp 16   Ht 5'  9" (1.753 m)   Wt 74.8 kg   SpO2 100%   BMI 24.37 kg/m  Physical Exam Vitals and nursing note reviewed.  Constitutional:      General: He is not in acute distress.    Appearance: Normal appearance.  HENT:     Head: Normocephalic and atraumatic.  Eyes:     General:        Right eye: No discharge.        Left eye: No discharge.  Cardiovascular:     Rate and Rhythm: Regular rhythm. Tachycardia present.     Heart sounds: No murmur heard.    No friction rub. No gallop.  Pulmonary:     Effort: Pulmonary effort is normal.     Breath sounds: Normal breath sounds.  Abdominal:     General: Bowel sounds are normal.     Palpations: Abdomen is soft.  Skin:    General: Skin is warm and dry.     Capillary Refill: Capillary refill takes less than 2 seconds.  Neurological:     Mental Status: He is alert and oriented to person, place, and time.  Psychiatric:        Mood and Affect: Mood normal.        Behavior: Behavior normal.     ED Results / Procedures / Treatments   Labs (all labs ordered are listed, but only abnormal results  are displayed) Labs Reviewed  BASIC METABOLIC PANEL - Abnormal; Notable for the following components:      Result Value   Potassium 3.3 (*)    Glucose, Bld 101 (*)    All other components within normal limits  CBC  D-DIMER, QUANTITATIVE  TROPONIN I (HIGH SENSITIVITY)    EKG EKG Interpretation Date/Time:  Saturday February 01 2023 14:54:20 EDT Ventricular Rate:  112 PR Interval:  140 QRS Duration:  78 QT Interval:  294 QTC Calculation: 401 R Axis:   60  Text Interpretation: Sinus tachycardia Right atrial enlargement Nonspecific T wave abnormality Abnormal ECG No previous ECGs available Confirmed by Eber Hong ([id number]) on 02/01/2023 3:32:35 PM  Radiology DG Chest 2 View  Result Date: 02/01/2023 CLINICAL DATA:  Chest pain and hand numbness for 1 week. EXAM: CHEST - 2 VIEW COMPARISON:  None Available. FINDINGS: The heart size and mediastinal  contours are within normal limits. Both lungs are clear. Mild thoracic dextroscoliosis noted. IMPRESSION: No active cardiopulmonary disease. Electronically Signed   By: Danae Orleans M.D.   On: 02/01/2023 16:41    Procedures Procedures    Medications Ordered in ED Medications - No data to display  ED Course/ Medical Decision Making/ A&P                                 Medical Decision Making Amount and/or Complexity of Data Reviewed Labs: ordered. Radiology: ordered.   This patient is a 27 y.o. male  who presents to the ED for concern of chest pain.   Differential diagnoses prior to evaluation: The emergent differential diagnosis includes, but is not limited to,  ACS, AAS, PE, Mallory-Weiss, Boerhaave's, Pneumonia, acute bronchitis, asthma or COPD exacerbation, anxiety, MSK pain or traumatic injury to the chest, acid reflux versus other. This is not an exhaustive differential.   Past Medical History / Co-morbidities / Social History: chronic pain syndrome, palpitations, PVCs, anxiety  Additional history: Chart reviewed. Pertinent results include: Reviewed lab work, imaging from previous emergency department visits, remote evaluation for palpitations  Physical Exam: Physical exam performed. The pertinent findings include: Overall well-appearing but with some tachycardia, normal rhythm.  Lab Tests/Imaging studies: I personally interpreted labs/imaging and the pertinent results include: CBC unremarkable, initial troponin normal, BMP with mild hypokalemia potassium 3.3, normal D-dimer.  Do not think delta troponin is needed with chest pain ongoing for a week.  Plain film chest x-ray independently interpreted by myself shows no evidence of acute intrathoracic abnormality.. I agree with the radiologist interpretation.  Cardiac monitoring: EKG obtained and interpreted by myself and attending physician which shows: Sinus tachycardia, nonspecific T wave abnormality   Disposition: After  consideration of the diagnostic results and the patients response to treatment, I feel that patient with nonspecific chest pain, nonexertional in nature, despite some tachycardia in the room his other workup today is very reassuring, think that he is stable for discharge at this time, encourage cardiology follow-up as needed.   emergency department workup does not suggest an emergent condition requiring admission or immediate intervention beyond what has been performed at this time. The plan is: as above. The patient is safe for discharge and has been instructed to return immediately for worsening symptoms, change in symptoms or any other concerns.  Final Clinical Impression(s) / ED Diagnoses Final diagnoses:  Atypical chest pain  Hypokalemia    Rx / DC Orders ED Discharge Orders  None         [place] 02/01/23 1710    Bethann Berkshire, MD 02/04/23 1212

## 2023-02-01 NOTE — Discharge Instructions (Addendum)
Please follow-up with your primary care doctor if you continue to have chest pain or the cardiologist that you have seen in the past.  Please return to the emergency department if you have significant worsening of chest pain, develop significant shortness of breath or have any other complications.

## 2023-02-03 DIAGNOSIS — F4323 Adjustment disorder with mixed anxiety and depressed mood: Secondary | ICD-10-CM | POA: Diagnosis not present

## 2023-03-03 DIAGNOSIS — F4323 Adjustment disorder with mixed anxiety and depressed mood: Secondary | ICD-10-CM | POA: Diagnosis not present

## 2023-03-06 DIAGNOSIS — Z6825 Body mass index (BMI) 25.0-25.9, adult: Secondary | ICD-10-CM | POA: Diagnosis not present

## 2023-03-06 DIAGNOSIS — R030 Elevated blood-pressure reading, without diagnosis of hypertension: Secondary | ICD-10-CM | POA: Diagnosis not present

## 2023-03-06 DIAGNOSIS — R5383 Other fatigue: Secondary | ICD-10-CM | POA: Diagnosis not present

## 2023-03-06 DIAGNOSIS — J039 Acute tonsillitis, unspecified: Secondary | ICD-10-CM | POA: Diagnosis not present

## 2023-03-06 DIAGNOSIS — F419 Anxiety disorder, unspecified: Secondary | ICD-10-CM | POA: Diagnosis not present

## 2023-03-17 DIAGNOSIS — F4323 Adjustment disorder with mixed anxiety and depressed mood: Secondary | ICD-10-CM | POA: Diagnosis not present

## 2023-04-03 DIAGNOSIS — Z6826 Body mass index (BMI) 26.0-26.9, adult: Secondary | ICD-10-CM | POA: Diagnosis not present

## 2023-04-03 DIAGNOSIS — F419 Anxiety disorder, unspecified: Secondary | ICD-10-CM | POA: Diagnosis not present

## 2023-04-03 DIAGNOSIS — E559 Vitamin D deficiency, unspecified: Secondary | ICD-10-CM | POA: Diagnosis not present

## 2023-04-03 DIAGNOSIS — R030 Elevated blood-pressure reading, without diagnosis of hypertension: Secondary | ICD-10-CM | POA: Diagnosis not present

## 2023-04-14 DIAGNOSIS — F4323 Adjustment disorder with mixed anxiety and depressed mood: Secondary | ICD-10-CM | POA: Diagnosis not present

## 2023-04-28 DIAGNOSIS — F4323 Adjustment disorder with mixed anxiety and depressed mood: Secondary | ICD-10-CM | POA: Diagnosis not present

## 2023-05-12 DIAGNOSIS — F4323 Adjustment disorder with mixed anxiety and depressed mood: Secondary | ICD-10-CM | POA: Diagnosis not present

## 2023-05-25 DIAGNOSIS — F411 Generalized anxiety disorder: Secondary | ICD-10-CM | POA: Diagnosis not present

## 2023-05-26 DIAGNOSIS — F4323 Adjustment disorder with mixed anxiety and depressed mood: Secondary | ICD-10-CM | POA: Diagnosis not present

## 2023-06-06 DIAGNOSIS — S338XXA Sprain of other parts of lumbar spine and pelvis, initial encounter: Secondary | ICD-10-CM | POA: Diagnosis not present

## 2023-06-06 DIAGNOSIS — S233XXA Sprain of ligaments of thoracic spine, initial encounter: Secondary | ICD-10-CM | POA: Diagnosis not present

## 2023-06-06 DIAGNOSIS — S134XXA Sprain of ligaments of cervical spine, initial encounter: Secondary | ICD-10-CM | POA: Diagnosis not present

## 2023-06-23 DIAGNOSIS — F4323 Adjustment disorder with mixed anxiety and depressed mood: Secondary | ICD-10-CM | POA: Diagnosis not present

## 2023-07-21 DIAGNOSIS — F4323 Adjustment disorder with mixed anxiety and depressed mood: Secondary | ICD-10-CM | POA: Diagnosis not present

## 2023-08-24 IMAGING — US US PELVIS LIMITED
1 series · 14 of 25 positions shown · non-contrast
Comparison: None Available.

CLINICAL DATA: Bilateral inguinal hernia

EXAM:
ULTRASOUND OF Bilateral GROIN SOFT TISSUES
TECHNIQUE: Ultrasound examination of the groin soft tissues was performed in
the area of clinical concern.

[Series 1: us pelvis limited · 0.09mm/px · 25 acquisitions, 14 frames shown]
[im 1/25]
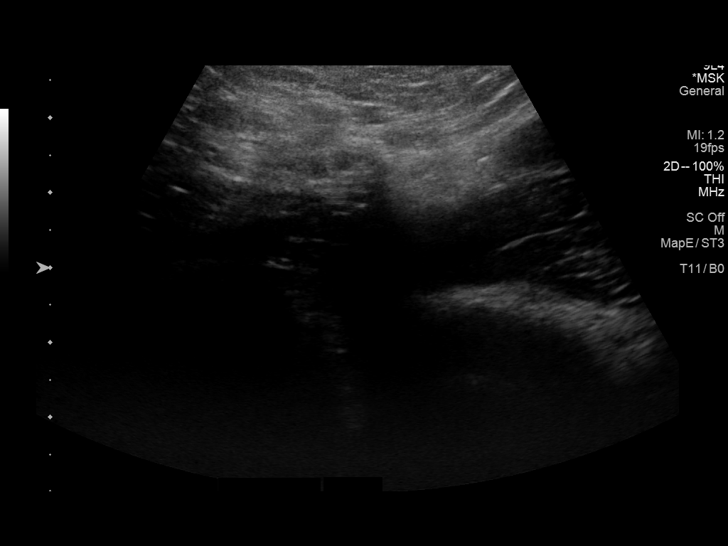
[im 3/25]
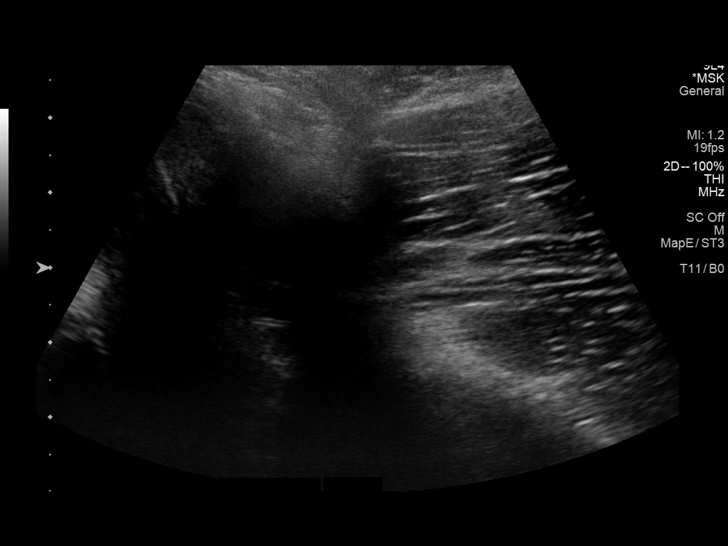
[im 5/25]
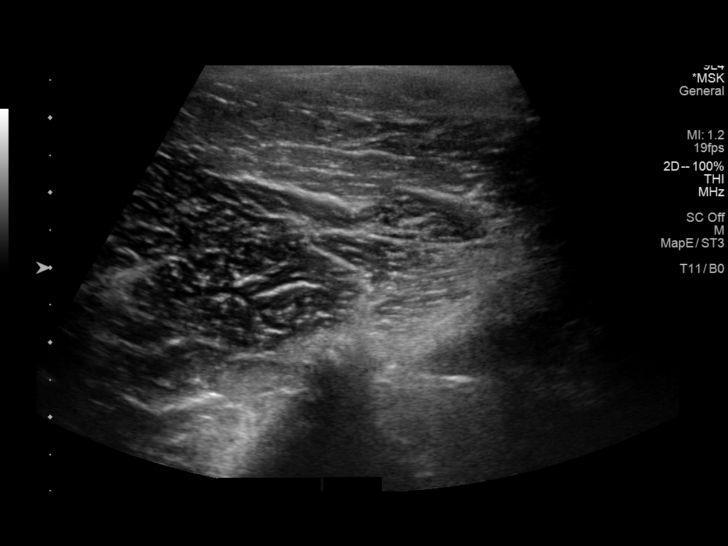
[im 7/25]
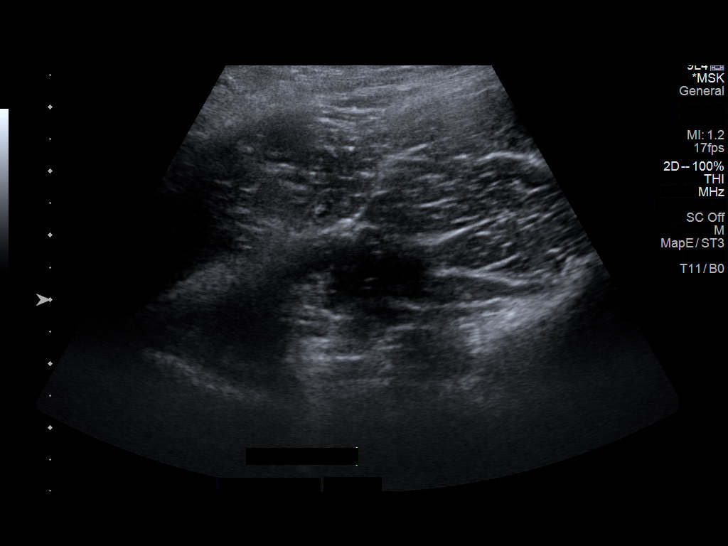
[im 9/25]
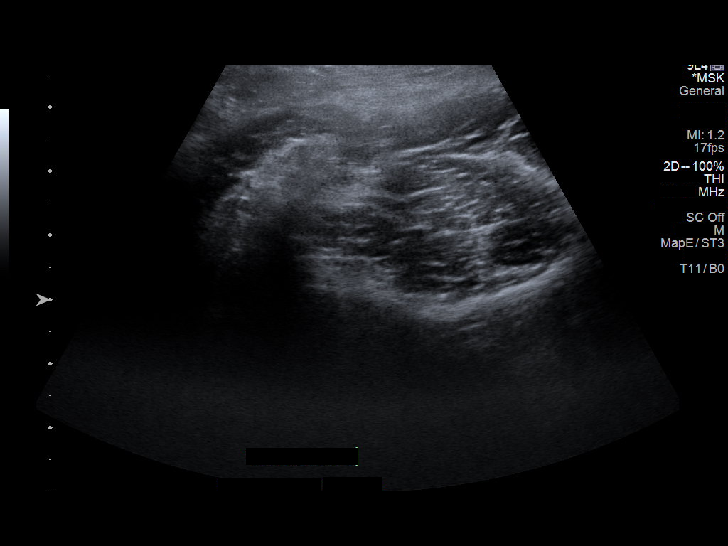
[im 10/25]
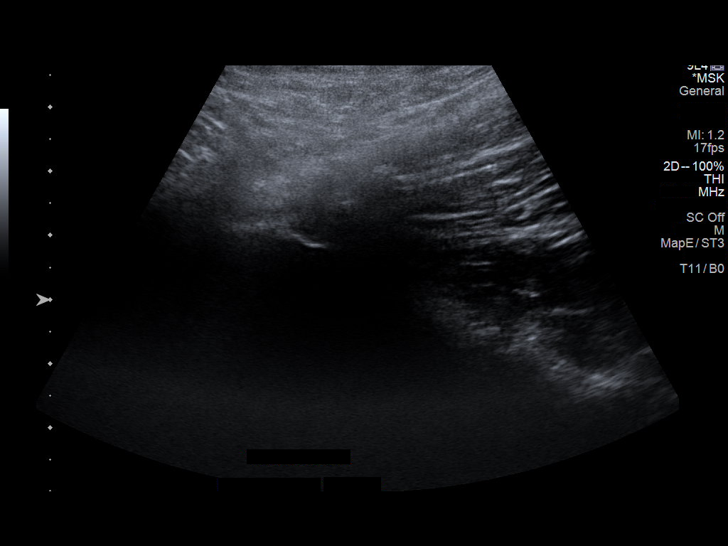
[im 12/25]
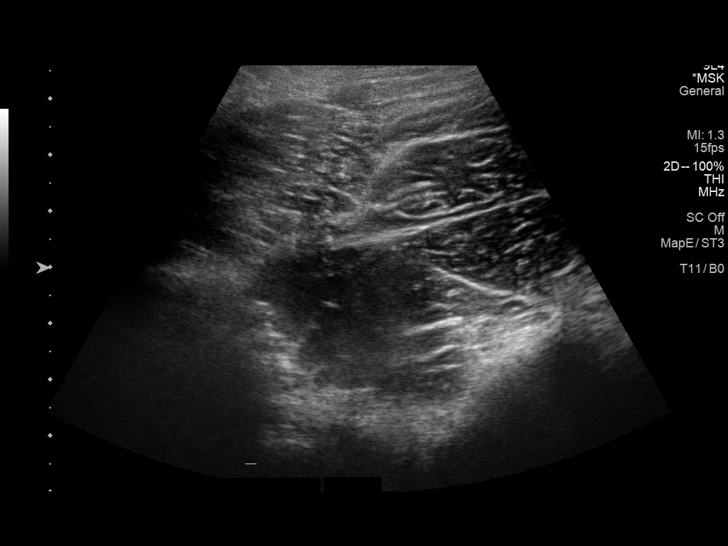
[im 14/25]
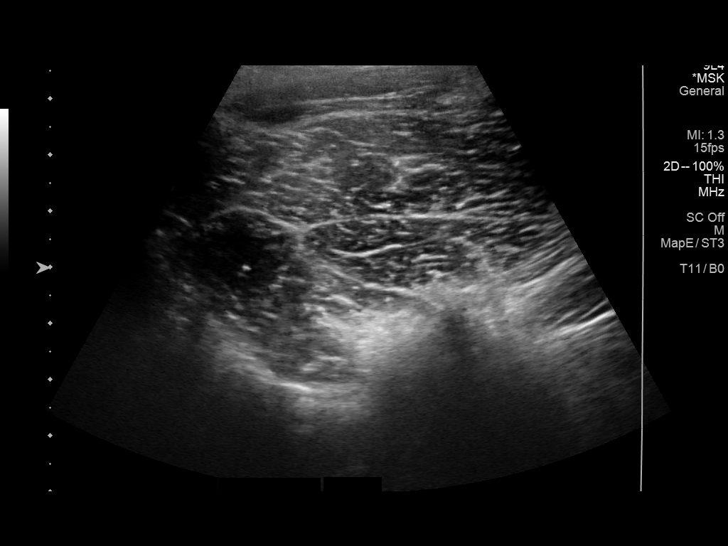
[im 16/25]
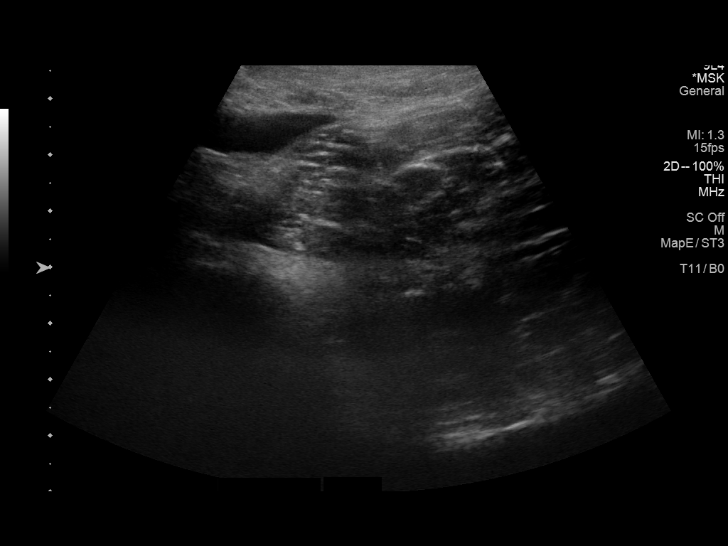
[im 17/25]
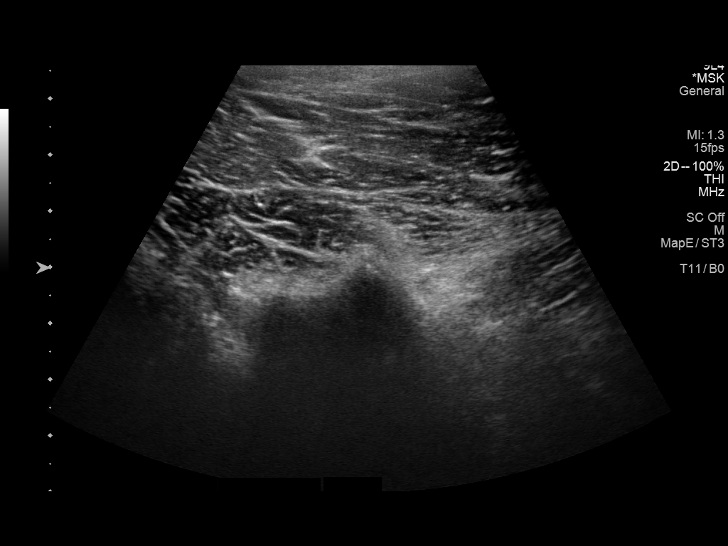
[im 19/25]
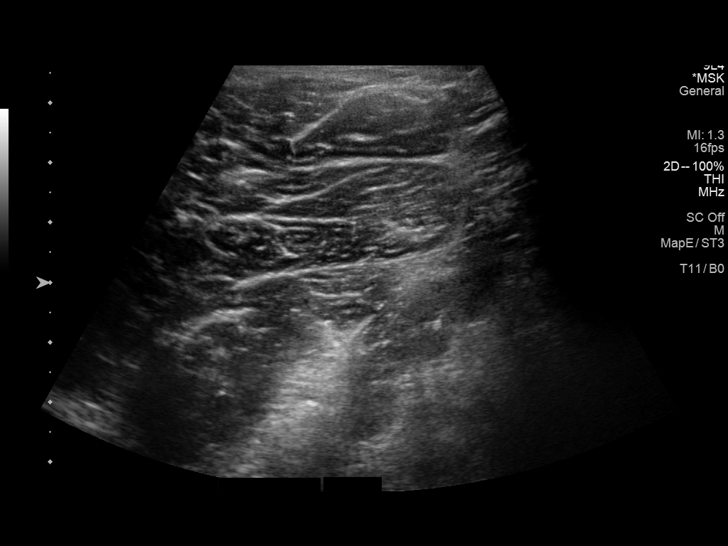
[im 21/25]
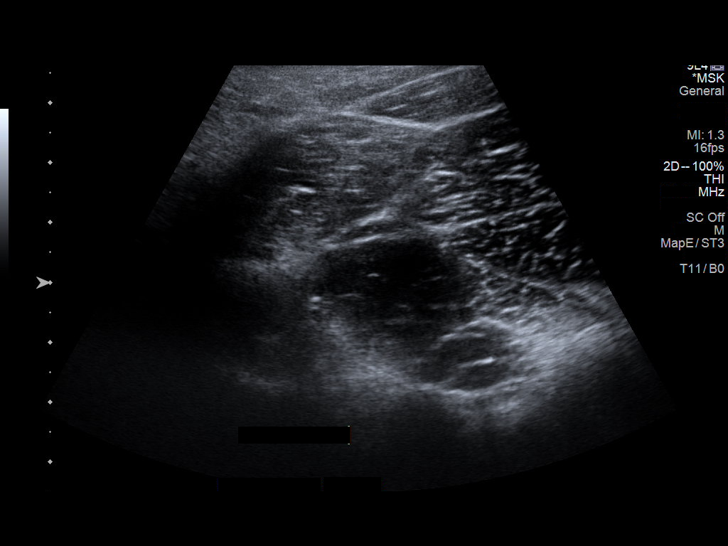
[im 23/25]
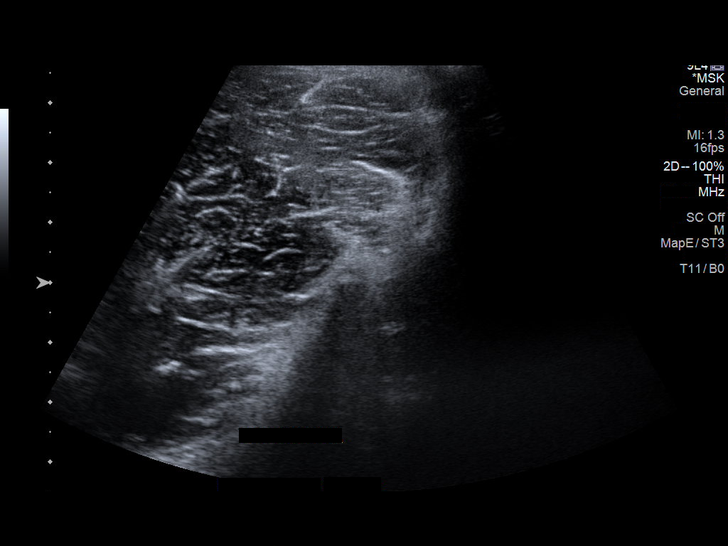
[im 25/25]
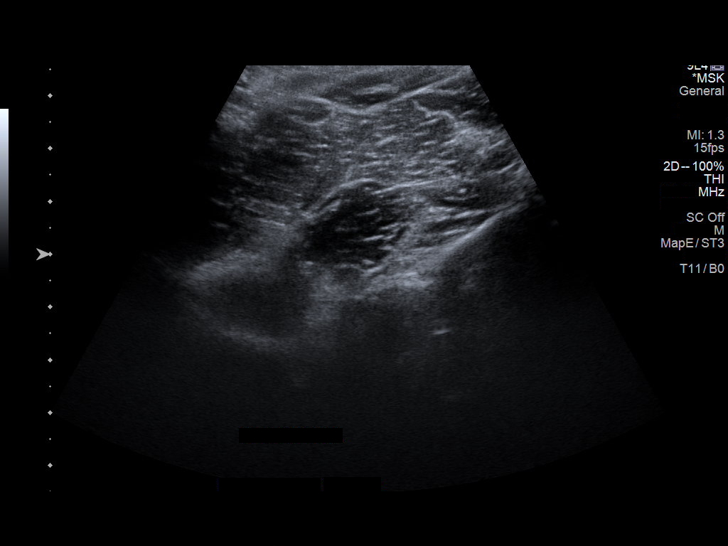

[14 of 25 positions shown; findings below may reference images not displayed]

FINDINGS: Limited grayscale sonography of the inguinal regions bilaterally
performed with and without Valsalva maneuver demonstrates no
inducible inguinal hernia. No abnormal subcutaneous soft tissue
mass, fluid collection, or calcification. No pathologic adenopathy.
IMPRESSION: No inguinal hernia identified. No sonographic correlate for the
patient's reported local tenderness.

## 2023-09-06 IMAGING — CT CT PELVIS W/ CM
2 of 3 series · 14 of 46 positions shown, 16 images · IV contrast (agent unspecified)
Comparison: Ultrasound pelvis 09/12/2021

CLINICAL DATA: Bilateral inguinal hernia

Prior hernia repair
Continued pain
EXAM:
CT PELVIS WITH CONTRAST
TECHNIQUE: Multidetector CT imaging of the pelvis was performed using the
standard protocol following the bolus administration of intravenous
contrast.

[Series 2: pelvis 5.00 br40 s3 · axial · 0.59mm/px · z∈[+1035,+1290]mm · 11 of 59 slices shown, 13 images]
[im 4/59  soft-tissue]
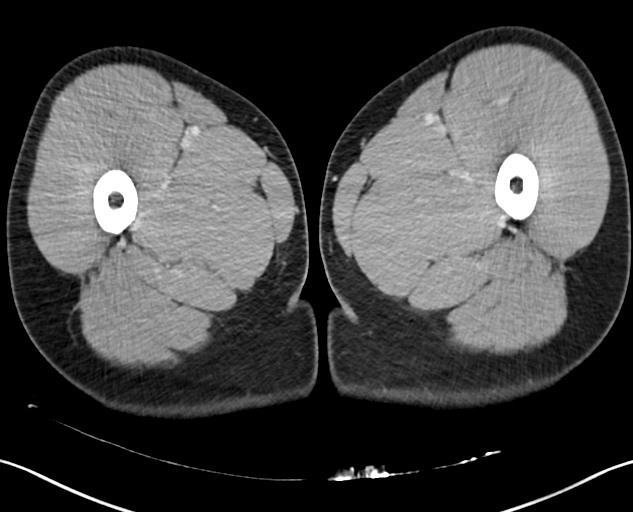
[im 4/59  bone]
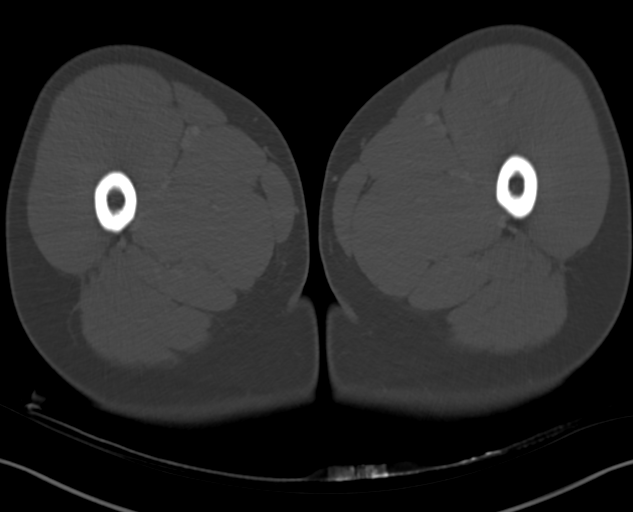
[im 10/59  soft-tissue]
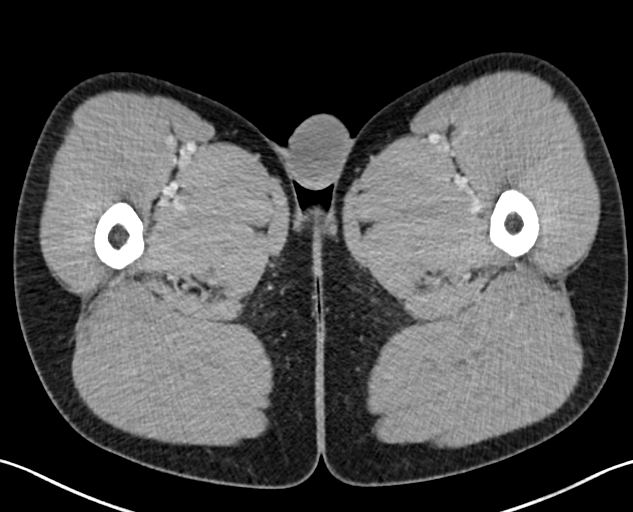
[im 14/59  soft-tissue]
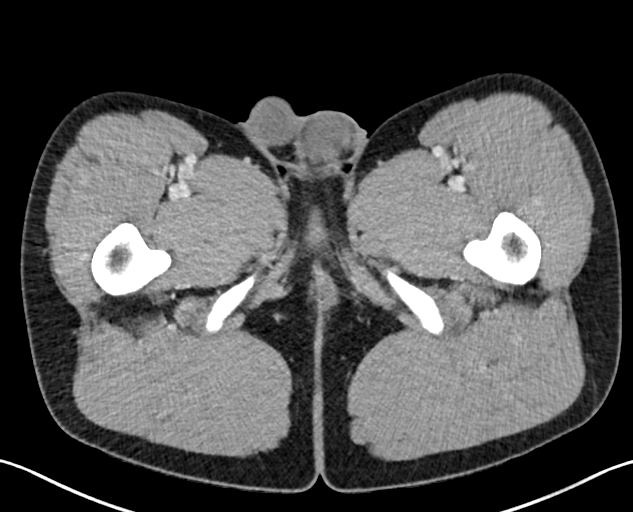
[im 19/59  soft-tissue]
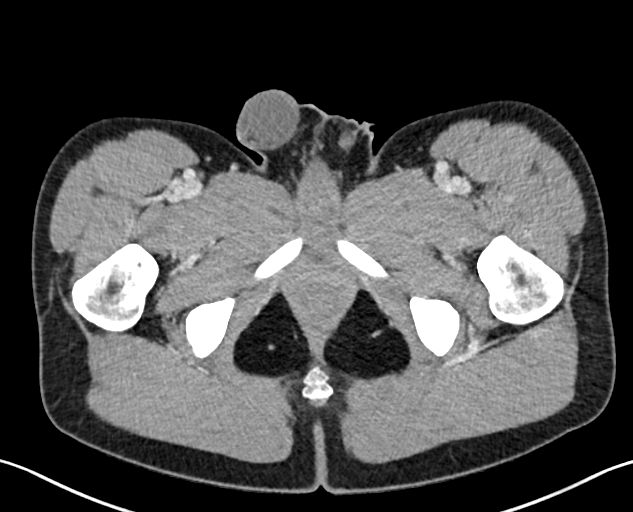
[im 25/59  soft-tissue]
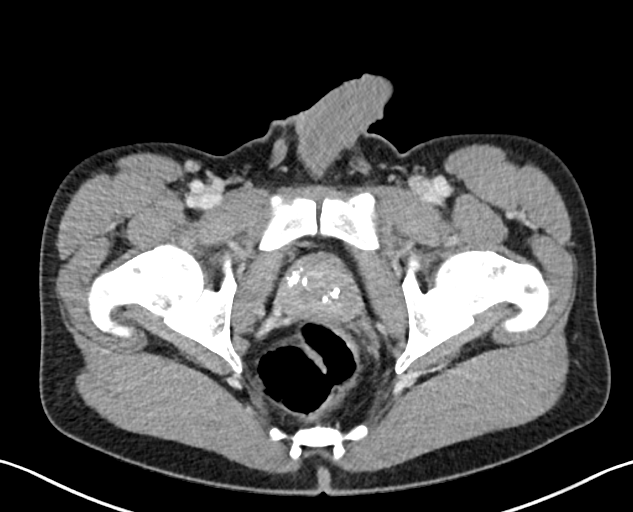
[im 30/59  soft-tissue]
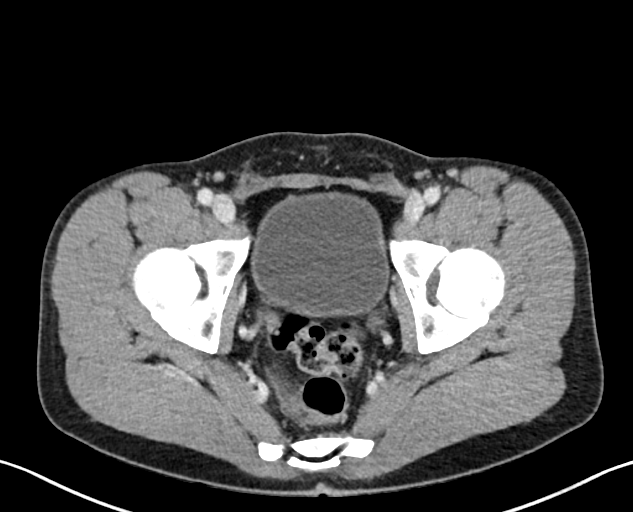
[im 34/59  soft-tissue]
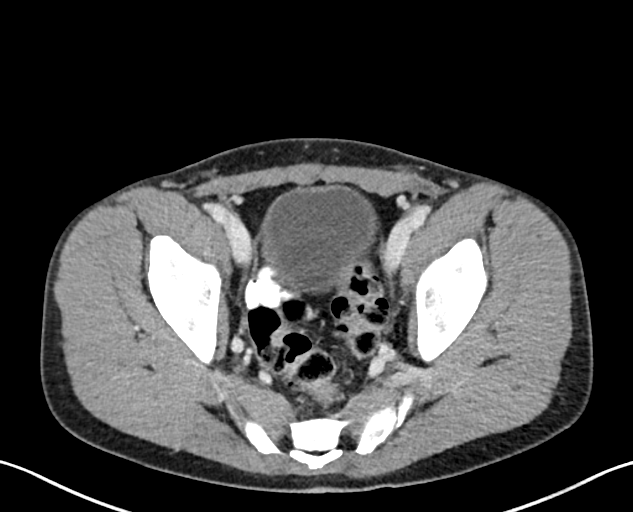
[im 40/59  soft-tissue]
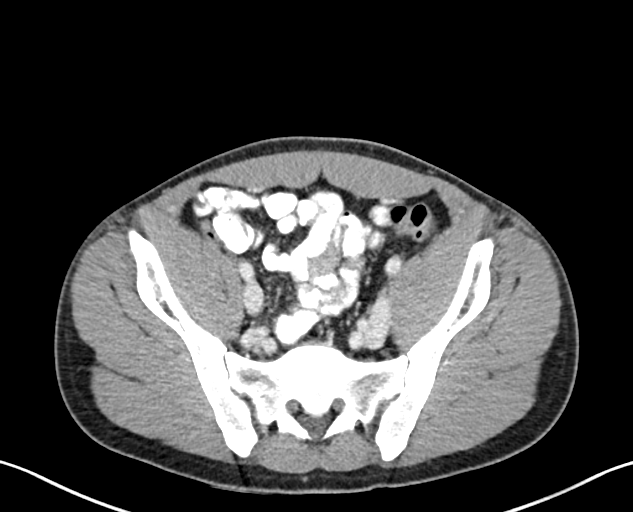
[im 45/59  soft-tissue]
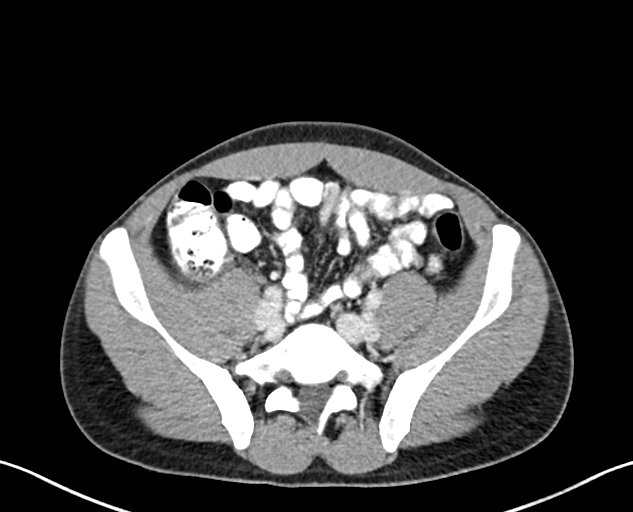
[im 45/59  bone]
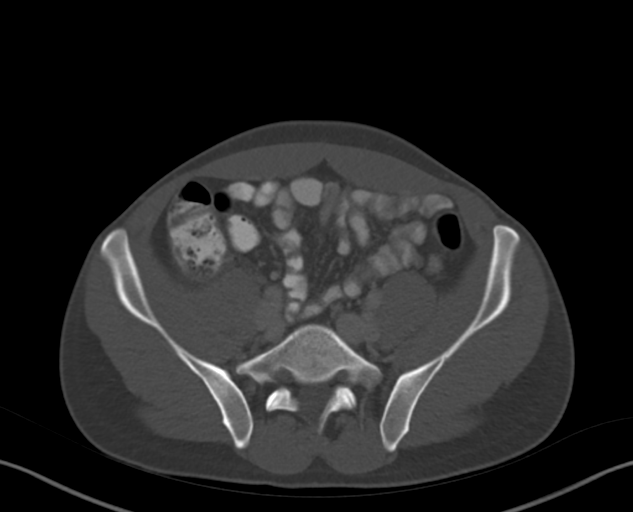
[im 49/59  soft-tissue]
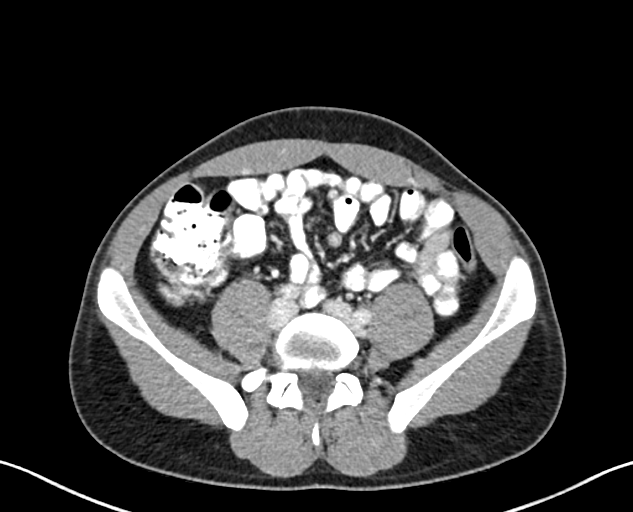
[im 55/59  soft-tissue]
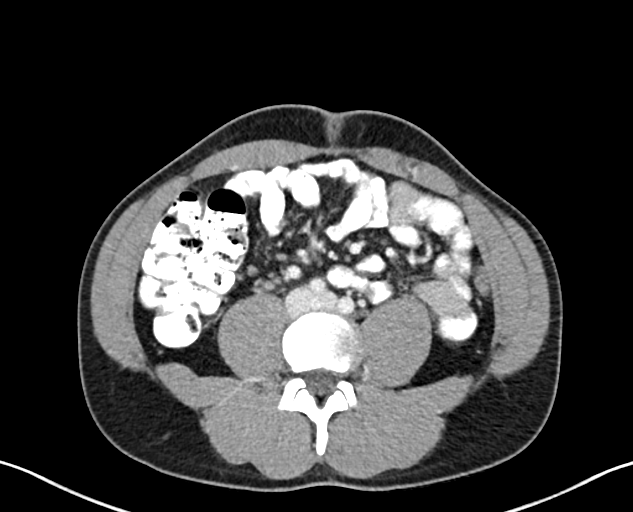

[Series 4: pelvis 2.00 br40 s3 cor · coronal · 0.58mm/px · 3 of 151 slices shown]
[im 51/151  soft-tissue]
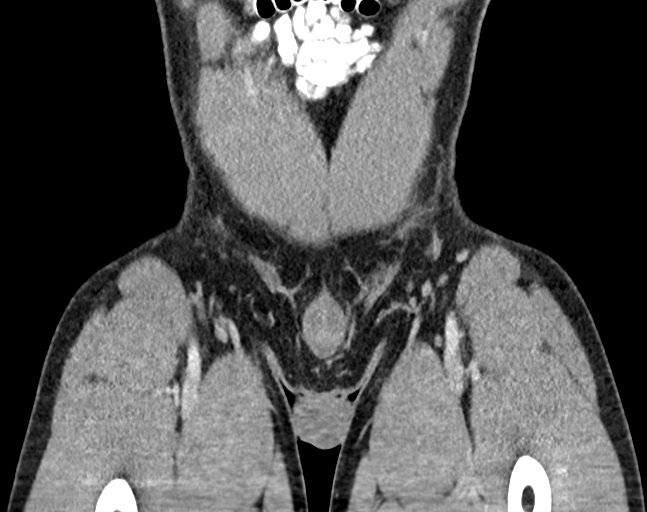
[im 67/151  soft-tissue]
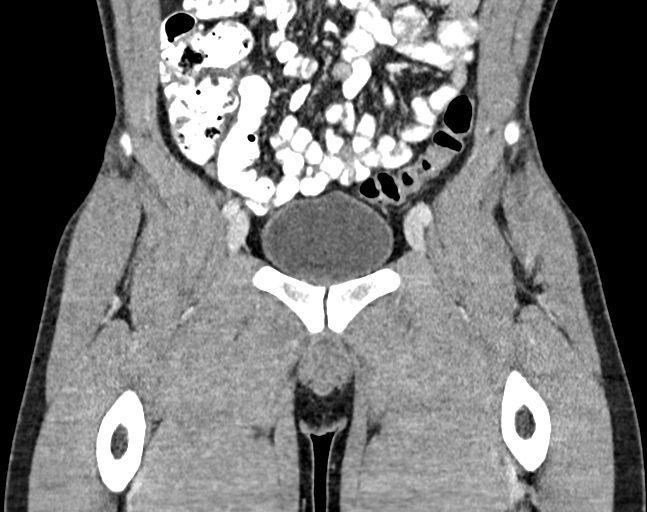
[im 84/151  soft-tissue]
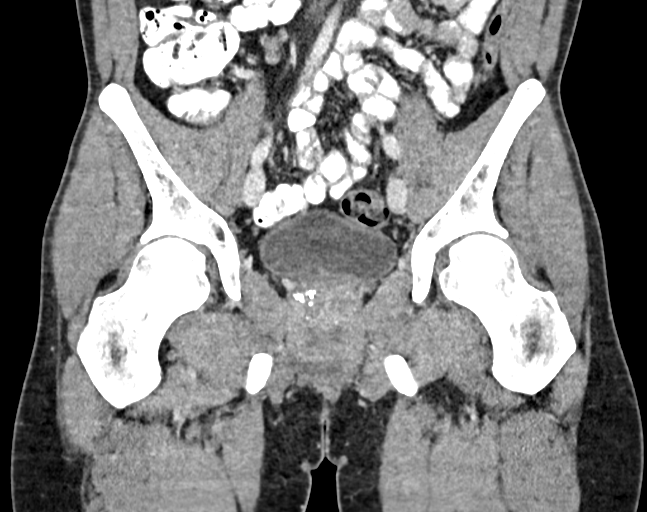

[14 of 46 positions shown; findings below may reference images not displayed]

RADIATION DOSE REDUCTION: This exam was performed according to the
departmental dose-optimization program which includes automated
exposure control, adjustment of the mA and/or kV according to
patient size and/or use of iterative reconstruction technique.

CONTRAST:  100mL NEJT06-SQQ IOPAMIDOL (NEJT06-SQQ) INJECTION 61%
FINDINGS: Urinary Tract:  No abnormality visualized.

Bowel:  Unremarkable visualized pelvic bowel loops.

Vascular/Lymphatic: No pathologically enlarged lymph nodes. No
significant vascular abnormality seen.

Reproductive:  Extensive calcifications of the prostate.

Other:  No inguinal hernias.

Musculoskeletal: No suspicious bone lesions identified.
IMPRESSION: 1. No recurrent inguinal hernias.
2. Extensive calcifications of the prostate.

## 2023-09-15 DIAGNOSIS — F4323 Adjustment disorder with mixed anxiety and depressed mood: Secondary | ICD-10-CM | POA: Diagnosis not present

## 2023-10-13 DIAGNOSIS — F4323 Adjustment disorder with mixed anxiety and depressed mood: Secondary | ICD-10-CM | POA: Diagnosis not present

## 2023-10-28 ENCOUNTER — Ambulatory Visit: Attending: Internal Medicine | Admitting: Internal Medicine

## 2023-10-28 ENCOUNTER — Encounter: Payer: Self-pay | Admitting: Internal Medicine

## 2023-10-28 VITALS — BP 138/88 | HR 97 | Ht 68.0 in | Wt 185.0 lb

## 2023-10-28 DIAGNOSIS — R009 Unspecified abnormalities of heart beat: Secondary | ICD-10-CM

## 2023-10-28 DIAGNOSIS — I10 Essential (primary) hypertension: Secondary | ICD-10-CM | POA: Diagnosis not present

## 2023-10-28 MED ORDER — CHLORTHALIDONE 25 MG PO TABS: 25.0000 mg | ORAL_TABLET | Freq: Every day | ORAL | 5 refills | Status: DC

## 2023-10-28 MED ORDER — CHLORTHALIDONE 25 MG PO TABS: 25.0000 mg | ORAL_TABLET | Freq: Every day | ORAL | 5 refills | Status: AC

## 2023-10-28 NOTE — Patient Instructions (Addendum)
 Medication Instructions:  Your physician has recommended you make the following change in your medication:  Start Chlorthalidone 25 mg once daily Continue taking all other medications as prescribed   Labwork: None   Testing/Procedures: Your physician has recommended that you have a sleep study. This test records several body functions during sleep, including: brain activity, eye movement, oxygen and carbon dioxide blood levels, heart rate and rhythm, breathing rate and rhythm, the flow of air through your mouth and nose, snoring, body muscle movements, and chest and belly movement.   Follow-Up: Your physician recommends that you schedule a follow-up appointment in: 1 year with Almarie  Any Other Special Instructions Will Be Listed Below (If Applicable). Thank you for choosing [facility]!     If you need a refill on your cardiac medications before your next appointment, please call your pharmacy.

## 2023-10-28 NOTE — Progress Notes (Signed)
 Cardiology Office Note  Date: 10/28/2023   ID: BROGEN DUELL, DOB [birth year], MRN [id number]  PCP:  Jeanette Comer BRAVO, PA-C  Cardiologist:  Diannah SHAUNNA Maywood, MD Electrophysiologist:  None   History of Present Illness: Lawrence Whitehead is a 28 y.o. male  Referred to cardiology clinic for chest pain.  Echocardiogram in 2022 is unremarkable.  Event monitor in 2022 is unremarkable as well.  He works out in [employer] almost 5 days a week.  He does both cardio and strength training.  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  He denied have any symptoms of angina or DOE during these activities.  He did report having chest pain, SOB, dizziness, palpitations sometimes at rest.  He checked blood pressures at home and they were elevated, more than 130 mmHg SBP.  BP during the clinic is 138 mmHg stable.  He is currently on propranolol 10 mg at bedtime that was started by PCP, he is asking today if he has to be on it.  He was also recently diagnosed with low testosterone  levels and wanted to know if it is okay to take the testosterone  shots.  He has a history of anxiety but does not take Ativan unless needed.  He takes it once in 3 weeks.  Past Medical History:  Diagnosis Date   Palpitations    Premature atrial contractions    PVC's (premature ventricular contractions)     Past Surgical History:  Procedure Laterality Date   HERNIA REPAIR  01/2016    Current Outpatient Medications  Medication Sig Dispense Refill   amoxicillin-clavulanate (AUGMENTIN) 875-125 MG tablet Take 1 tablet by mouth 2 (two) times daily.     ibuprofen (ADVIL) 200 MG tablet Take 200 mg by mouth every 6 (six) hours as needed for fever or headache.     Lactobacillus (PROBIOTIC ACIDOPHILUS PO) Take 1 capsule by mouth daily.     LORazepam (ATIVAN) 1 MG tablet Take 1 mg by mouth as needed for anxiety.     Multiple Vitamins-Minerals (MULTIVITAMIN MEN PO) Take 2 tablets by mouth daily.     OVER THE  COUNTER MEDICATION Take 1 capsule by mouth daily. Blend of vit b12, ginseng, ginko biloba     No current facility-administered medications for this visit.   Allergies:  Patient has no known allergies.   Social History: The patient  reports that he has been smoking cigarettes. He has never used smokeless tobacco. He reports current alcohol use. He reports that he does not use drugs.   Family History: The patient's family history includes Hypertension in his father.   ROS:  Please see the history of present illness. Otherwise, complete review of systems is positive for none  All other systems are reviewed and negative.   Physical Exam: VS:  There were no vitals taken for this visit., BMI There is no height or weight on file to calculate BMI.  Wt Readings from Last 3 Encounters:  02/01/23 165 lb (74.8 kg)  02/06/22 164 lb (74.4 kg)  09/07/21 164 lb 6.4 oz (74.6 kg)    General: Patient appears comfortable at rest. HEENT: Conjunctiva and lids normal, oropharynx clear with moist mucosa. Neck: Supple, no elevated JVP or carotid bruits, no thyromegaly. Lungs: Clear to auscultation, nonlabored breathing at rest. Cardiac: Regular rate and rhythm, no S3 or significant systolic murmur, no pericardial rub. Abdomen: Soft, nontender, no hepatomegaly, bowel sounds present, no guarding or rebound. Extremities: No pitting  edema, distal pulses 2+. Skin: Warm and dry. Musculoskeletal: No kyphosis. Neuropsychiatric: Alert and oriented x3, affect grossly appropriate.  Recent Labwork: 02/01/2023: BUN 12; Creatinine, Ser 0.83; Hemoglobin 16.0; Platelets 265; Potassium 3.3; Sodium 135  No results found for: CHOL, TRIG, HDL, CHOLHDL, VLDL, LDLCALC, LDLDIRECT  Other Studies Reviewed Today:   Assessment and Plan:  Noncardiac chest pain: Patient has resting chest pain, SOB, dizziness and palpitation symptoms.  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  Echocardiogram and event monitor from 2022 was unremarkable.  Elevated heart rate: Currently propranolol 10 mg daily at bedtime.  Patient is inquiring if he has to be on this medication.  He was started on this medication 3 months ago.  Event monitor in 2022 showed average HR 85 bpm, no indication to be on event monitor unless his heart rates are more than 90 consistently.  Can purchase a smart watch for HR trends and then start propranolol 3 times daily.  Elevated blood pressure without diagnosis of HTN: Home BP and clinic BP both consistently elevated, more than 130 mmHg SBP.  Consistent with HTN diagnosis.  Start chlorthalidone 25 mg once daily.  If he notices dizziness, he will need to cut back on the dose of chlorthalidone from 25 mg to 12.5 mg once daily.  Needs OSA testing, he will call us  back when he switches to a better insurance so we can send him WatchPAT testing.  I reviewed the images from 2022, he does not have evidence of correlation of aorta.  Encouraged weight loss.       Medication Adjustments/Labs and Tests Ordered: Current medicines are reviewed at length with the patient today.  Concerns regarding medicines are outlined above.    Disposition:  Follow up 1 year with APP  Signed Abbygale Lapid Arleta Maywood, MD, 10/28/2023 3:26 PM    [facility] [address] [place], [place], [place] [address]

## 2023-11-27 DIAGNOSIS — F4323 Adjustment disorder with mixed anxiety and depressed mood: Secondary | ICD-10-CM | POA: Diagnosis not present

## 2023-11-29 DIAGNOSIS — R739 Hyperglycemia, unspecified: Secondary | ICD-10-CM | POA: Diagnosis not present

## 2023-11-29 DIAGNOSIS — R5383 Other fatigue: Secondary | ICD-10-CM | POA: Diagnosis not present

## 2023-11-29 DIAGNOSIS — Z6827 Body mass index (BMI) 27.0-27.9, adult: Secondary | ICD-10-CM | POA: Diagnosis not present

## 2023-12-22 DIAGNOSIS — F4323 Adjustment disorder with mixed anxiety and depressed mood: Secondary | ICD-10-CM | POA: Diagnosis not present

## 2024-03-15 DIAGNOSIS — F4323 Adjustment disorder with mixed anxiety and depressed mood: Secondary | ICD-10-CM | POA: Diagnosis not present

## 2024-05-21 ENCOUNTER — Ambulatory Visit: Admitting: Urology

## 2024-05-21 ENCOUNTER — Encounter: Payer: Self-pay | Admitting: Urology

## 2024-05-21 VITALS — BP 135/79 | HR 114

## 2024-05-21 DIAGNOSIS — N433 Hydrocele, unspecified: Secondary | ICD-10-CM | POA: Diagnosis not present

## 2024-05-21 DIAGNOSIS — N411 Chronic prostatitis: Secondary | ICD-10-CM

## 2024-05-21 DIAGNOSIS — E291 Testicular hypofunction: Secondary | ICD-10-CM

## 2024-05-21 LAB — URINALYSIS, ROUTINE W REFLEX MICROSCOPIC
Bilirubin, UA: NEGATIVE
Glucose, UA: NEGATIVE
Ketones, UA: NEGATIVE
Leukocytes,UA: NEGATIVE
Nitrite, UA: NEGATIVE
Protein,UA: NEGATIVE
RBC, UA: NEGATIVE
Specific Gravity, UA: 1.02 (ref 1.005–1.030)
Urobilinogen, Ur: 0.2 mg/dL (ref 0.2–1.0)
pH, UA: 7 (ref 5.0–7.5)

## 2024-05-21 MED ORDER — DOXYCYCLINE HYCLATE 100 MG PO CAPS: 100.0000 mg | ORAL_CAPSULE | Freq: Two times a day (BID) | ORAL | 0 refills | Status: DC

## 2024-05-21 NOTE — Progress Notes (Signed)
 "  05/21/2024 10:29 AM   Lawrence Whitehead 1995/10/27 [id number]  Referring provider: Dow Longs, PA-C [address] [place],  [place] [address]  hydrocele   HPI: Lawrence Whitehead is a 28yo here for evaluation of bilateral hydroceles and hypogonadism. He had bilateral inguinal hernia repair at age 52 and developed bilateral scrotal swelling and bilateral testicular pain after the procedure. He has a history of frequent UTI.  He has had prior UTI's starting at age 63 and has had about 1 per year since he was 63yr. His symptoms are generally dysuria. He has dysuria and urinary frequency. PSA is 1.8. He has a hx of prostatic stones.  He has had issues with getting and maintaining an erection for the past year. He has had 2 testosterone  labs, 435, 418.    PMH: Past Medical History:  Diagnosis Date   Palpitations    Premature atrial contractions    PVC's (premature ventricular contractions)     Surgical History: Past Surgical History:  Procedure Laterality Date   HERNIA REPAIR  01/2016    Home Medications:  Allergies as of 05/21/2024   No Known Allergies      Medication List        Accurate as of May 21, 2024 10:29 AM. If you have any questions, ask your nurse or doctor.          chlorthalidone  25 MG tablet Commonly known as: HYGROTON  Take 1 tablet (25 mg total) by mouth daily.   ibuprofen 200 MG tablet Commonly known as: ADVIL Take 200 mg by mouth every 6 (six) hours as needed for fever or headache.   LORazepam 1 MG tablet Commonly known as: ATIVAN Take 1 mg by mouth as needed for anxiety.   MULTIVITAMIN MEN PO Take 2 tablets by mouth daily.   propranolol 10 MG tablet Commonly known as: INDERAL Take 10 mg by mouth at bedtime.        Allergies: Allergies[1]  Family History: Family History  Problem Relation Age of Onset   Hypertension Father    Colon cancer Neg Hx    Celiac disease Neg Hx    Inflammatory bowel disease Neg Hx     Social History:  reports  that he quit smoking about 12 months ago. His smoking use included cigarettes. He has never used smokeless tobacco. He reports current alcohol use. He reports that he does not use drugs.  ROS: All other review of systems were reviewed and are negative except what is noted above in HPI  Physical Exam: BP 135/79   Pulse (!) 114   Constitutional:  Alert and oriented, No acute distress. HEENT: [place] AT, moist mucus membranes.  Trachea midline, no masses. Cardiovascular: No clubbing, cyanosis, or edema. Respiratory: Normal respiratory effort, no increased work of breathing. GI: Abdomen is soft, nontender, nondistended, no abdominal masses GU: No CVA tenderness. Circumcised phallus, no masses/lesions on bilateral testis. No pain to palpation Lymph: No cervical or inguinal lymphadenopathy. Skin: No rashes, bruises or suspicious lesions. Neurologic: Grossly intact, no focal deficits, moving all 4 extremities. Psychiatric: Normal mood and affect.  Laboratory Data: Lab Results  Component Value Date   WBC 7.8 02/01/2023   HGB 16.0 02/01/2023   HCT 45.8 02/01/2023   MCV 87.1 02/01/2023   PLT 265 02/01/2023    Lab Results  Component Value Date   CREATININE 0.83 02/01/2023    No results found for: PSA  No results found for: TESTOSTERONE   No results found for: HGBA1C  Urinalysis    Component Value Date/Time   APPEARANCEUR Clear 05/30/2022 1320   GLUCOSEU Negative 05/30/2022 1320   BILIRUBINUR Negative 05/30/2022 1320   PROTEINUR Negative 05/30/2022 1320   NITRITE Negative 05/30/2022 1320   LEUKOCYTESUR Negative 05/30/2022 1320    Lab Results  Component Value Date   LABMICR Comment 05/30/2022    Pertinent Imaging:  No results found for this or any previous visit.  No results found for this or any previous visit.  No results found for this or any previous visit.  No results found for this or any previous visit.  No results found for this or any previous  visit.  No results found for this or any previous visit.  No results found for this or any previous visit.  No results found for this or any previous visit.   Assessment & Plan:    1. Hydrocele of testis (Primary) Patient defers therapy at this time - Urinalysis, Routine w reflex microscopic  2. Hypogonadism male Followup 3 months with testosterone  and PSA  3. Chronic prostatitis -doxycycline  100mg  BID for 28 days   No follow-ups on file.  Lawrence Clara, MD  [facility] [place]       [1] No Known Allergies  "

## 2024-05-21 NOTE — Patient Instructions (Signed)

## 2024-05-27 ENCOUNTER — Telehealth: Payer: Self-pay | Admitting: Urology

## 2024-05-27 DIAGNOSIS — N411 Chronic prostatitis: Secondary | ICD-10-CM

## 2024-05-27 NOTE — Telephone Encounter (Signed)
 Doxy is making him light headed and almost passed out and had to lay down, he did not take today and feels better.

## 2024-05-28 MED ORDER — SULFAMETHOXAZOLE-TRIMETHOPRIM 800-160 MG PO TABS: 1.0000 | ORAL_TABLET | Freq: Two times a day (BID) | ORAL | 0 refills | Status: AC

## 2024-05-28 NOTE — Telephone Encounter (Signed)
 Return call to patient. Doxycycline  D/C and new RX Bactrim  BID sent in per Dr. Sherrilee. Pt voiced understanding

## 2024-08-05 ENCOUNTER — Other Ambulatory Visit

## 2024-08-11 ENCOUNTER — Ambulatory Visit: Admitting: Urology
